# Patient Record
Sex: Female | Born: 1980 | Race: White | Hispanic: No | Marital: Married | Smoking: Never smoker
Health system: Southern US, Community
[De-identification: ages and names within clinical notes are randomized; demographics above are authoritative.]

## PROBLEM LIST (undated history)

## (undated) DIAGNOSIS — O09219 Supervision of pregnancy with history of pre-term labor, unspecified trimester: Secondary | ICD-10-CM

## (undated) HISTORY — PX: WISDOM TOOTH EXTRACTION: SHX21

## (undated) HISTORY — DX: Supervision of pregnancy with history of pre-term labor, unspecified trimester: O09.219

---

## 2004-04-16 ENCOUNTER — Other Ambulatory Visit: Admission: RE | Admit: 2004-04-16 | Discharge: 2004-04-16 | Payer: Self-pay | Admitting: Obstetrics and Gynecology

## 2005-01-27 ENCOUNTER — Inpatient Hospital Stay (HOSPITAL_COMMUNITY): Admission: AD | Admit: 2005-01-27 | Discharge: 2005-01-27 | Payer: Self-pay | Admitting: Obstetrics and Gynecology

## 2006-01-15 ENCOUNTER — Inpatient Hospital Stay (HOSPITAL_COMMUNITY): Admission: AD | Admit: 2006-01-15 | Discharge: 2006-01-15 | Payer: Self-pay | Admitting: Obstetrics & Gynecology

## 2007-11-12 ENCOUNTER — Inpatient Hospital Stay (HOSPITAL_COMMUNITY): Admission: AD | Admit: 2007-11-12 | Discharge: 2007-11-12 | Payer: Self-pay | Admitting: Obstetrics and Gynecology

## 2008-02-01 ENCOUNTER — Inpatient Hospital Stay (HOSPITAL_COMMUNITY): Admission: AD | Admit: 2008-02-01 | Discharge: 2008-02-04 | Payer: Self-pay | Admitting: Obstetrics and Gynecology

## 2009-07-19 ENCOUNTER — Inpatient Hospital Stay (HOSPITAL_COMMUNITY): Admission: AD | Admit: 2009-07-19 | Discharge: 2009-07-19 | Payer: Self-pay | Admitting: Obstetrics and Gynecology

## 2009-07-22 ENCOUNTER — Inpatient Hospital Stay (HOSPITAL_COMMUNITY): Admission: AD | Admit: 2009-07-22 | Discharge: 2009-07-22 | Payer: Self-pay | Admitting: Obstetrics and Gynecology

## 2010-01-05 ENCOUNTER — Ambulatory Visit (HOSPITAL_COMMUNITY): Admission: RE | Admit: 2010-01-05 | Discharge: 2010-01-05 | Payer: Self-pay | Admitting: Obstetrics and Gynecology

## 2010-02-28 LAB — ABO/RH

## 2010-02-28 LAB — RUBELLA ANTIBODY, IGM: Rubella: IMMUNE

## 2010-02-28 LAB — RPR: RPR: NONREACTIVE

## 2010-02-28 LAB — HIV ANTIBODY (ROUTINE TESTING W REFLEX): HIV: NONREACTIVE

## 2010-03-16 ENCOUNTER — Encounter: Payer: Self-pay | Admitting: Obstetrics and Gynecology

## 2010-05-12 LAB — RH IMMUNE GLOBULIN WORKUP (NOT WOMEN'S HOSP): ABO/RH(D): A NEG

## 2010-05-12 LAB — CREATININE, SERUM
Creatinine, Ser: 0.55 mg/dL (ref 0.4–1.2)
GFR calc Af Amer: >60 mL/min (ref >60)
GFR calc non Af Amer: >60 mL/min (ref >60)

## 2010-05-12 LAB — CBC: WBC: 7.9 10*3/uL (ref 4.0–10.5)

## 2010-05-12 LAB — DIFFERENTIAL
Basophils Relative: 0 % (ref 0–1)
Eosinophils Absolute: 0.1 10*3/uL (ref 0.0–0.7)
Eosinophils Relative: 1 % (ref 0–5)
Neutrophils Relative %: 66 % (ref 43–77)

## 2010-05-12 LAB — HCG, QUANTITATIVE, PREGNANCY
hCG, Beta Chain, Quant, S: 3074 m[IU]/mL — ABNORMAL HIGH (ref <5)
hCG, Beta Chain, Quant, S: 4485 m[IU]/mL — ABNORMAL HIGH (ref <5)

## 2010-05-12 LAB — AST: AST: 17 U/L (ref 0–37)

## 2010-05-12 LAB — PLATELET COUNT: Platelets: 227 10*3/uL (ref 150–400)

## 2010-05-12 LAB — BUN: BUN: 11 mg/dL (ref 6–23)

## 2010-07-04 ENCOUNTER — Other Ambulatory Visit (HOSPITAL_COMMUNITY): Payer: Self-pay | Admitting: Advanced Practice Midwife

## 2010-07-04 DIAGNOSIS — O2690 Pregnancy related conditions, unspecified, unspecified trimester: Secondary | ICD-10-CM

## 2010-07-08 ENCOUNTER — Encounter (HOSPITAL_COMMUNITY): Payer: Self-pay

## 2010-07-08 ENCOUNTER — Inpatient Hospital Stay (HOSPITAL_COMMUNITY)
Admission: AD | Admit: 2010-07-08 | Discharge: 2010-07-08 | Disposition: A | Discharge status: Home / Self Care | Payer: Federal, State, Local not specified - PPO | Source: Ambulatory Visit | Attending: Obstetrics and Gynecology | Admitting: Obstetrics and Gynecology

## 2010-07-08 ENCOUNTER — Other Ambulatory Visit (HOSPITAL_COMMUNITY): Payer: Self-pay | Admitting: Advanced Practice Midwife

## 2010-07-08 ENCOUNTER — Ambulatory Visit (HOSPITAL_COMMUNITY)
Admission: RE | Admit: 2010-07-08 | Discharge: 2010-07-08 | Disposition: A | Discharge status: Home / Self Care | Payer: Federal, State, Local not specified - PPO | Source: Ambulatory Visit | Attending: Advanced Practice Midwife | Admitting: Advanced Practice Midwife

## 2010-07-08 DIAGNOSIS — O358XX0 Maternal care for other (suspected) fetal abnormality and damage, not applicable or unspecified: Secondary | ICD-10-CM | POA: Insufficient documentation

## 2010-07-08 DIAGNOSIS — Z2989 Encounter for other specified prophylactic measures: Secondary | ICD-10-CM | POA: Insufficient documentation

## 2010-07-08 DIAGNOSIS — Z363 Encounter for antenatal screening for malformations: Secondary | ICD-10-CM | POA: Insufficient documentation

## 2010-07-08 DIAGNOSIS — Z1389 Encounter for screening for other disorder: Secondary | ICD-10-CM | POA: Insufficient documentation

## 2010-07-08 DIAGNOSIS — Z298 Encounter for other specified prophylactic measures: Secondary | ICD-10-CM | POA: Insufficient documentation

## 2010-07-08 DIAGNOSIS — O2690 Pregnancy related conditions, unspecified, unspecified trimester: Secondary | ICD-10-CM

## 2010-07-08 NOTE — H&P (Signed)
NAMESHEYANN, SULTON               ACCOUNT NO.:  [id number]   MEDICAL RECORD NO.:  [id number]          PATIENT TYPE:  INP   LOCATION:  9176                          FACILITY:  [facility]   PHYSICIAN:  Natasha Fields, M.D.   DATE OF BIRTH:  1980-04-20   DATE OF ADMISSION:  02/01/2008  DATE OF DISCHARGE:  12/01/2007                              HISTORY & PHYSICAL   HISTORY OF PRESENT ILLNESS:  Natasha Fields is a 30 year old married white  female gravida 3, para 0-0-2-0 at 40-4/7 weeks per an [facility] of January 28, 2008 who is presenting for an induction of labor secondary to oligo at  40 weeks and 4 days.  She was in the office today for a postdate BPP and  AFI and AFI was equal to 6.36 cm which was equal to 3rd percentile.  Placenta was a grade 3, BPP was a 6/8 secondary to no fluid level.  Fetus is in vertex presentation.  Placenta is posterior.  Estimated  fetal weight 8 pounds 6 ounces which was equal to 83rd percentile (3817  grams).  They are expecting a girl.  The patient is without complaints  today.  She does report good fetal movement.  She is having irregular  contractions, thinks has had more mucousy discharge and thinks that she  lost her mucus plug.  She denies any vaginal bleeding today.  She did  have some after her last exam last week.  Fundal height is measuring 39  cm.  Her weight today is 228.  She had trace protein on a dip.  Blood  pressure was originally 150/100 and then her repeat was 130/86 here at  the office.  she has been followed by a nurse midwife service at [facility].   PAST MEDICAL HISTORY:  1. Infertility.  2. Rh negative.  3. The patient had congenital hip dislocation.  4. Pectoral excavation history of.  5. SAB X2.   REVIEW OF SYSTEMS:  She is without any complaints.  She denied any PIH  signs or symptoms.   PRENATAL LABORATORIES:  Blood type is A negative, Rh antibody screen  negative, hemoglobin 12.8 at her new OB which was Jul 06, 2007,  hematocrit 38.2.  RPR  nonreactive, rubella titer immune, hepatitis  surface antigen negative, HIV nonreactive.  Pap in July 2008 was within  normal limits.  She declined gonorrhea and Chlamydia cultures.  GBS was  negative in her third trimester.   OBSTETRICAL HISTORY:  Rhett Bannister 1 was a spontaneous abortion in 2006.  Rhett Bannister 2 was a spontaneous abortion in 2007.  Gravida 3 is current  pregnancy.   PAST MEDICAL HISTORY:  She denies medication allergies or latex  allergies.  She does report seasonal allergies.  She reports menarche at  age 40 with monthly cycles.  No abnormalities.  She had a certain LMP of  April 23, 2007, giving her best [facility] of January 28, 2008.  She did  receive RhoGAM with her two other miscarriages.  Blood type is A  negative.  She had used birth control pills and stopped in 2006.  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  Reports varicella  as a child.  Denies any STDs or abnormal Pap smears.  The patient  reports wisdom teeth extracted 2004.  Reports anesthesia causes nausea  and vomiting.   SOCIAL HISTORY:  She is a married white female.  Her husband's name is  Sherlean Foot.  He is involved and supportive.  The patient has a  bachelor's degree, is a full-time Environmental health practitioner.  Father of  baby does have a full-time job and has a bachelor's degree.  The patient  used alcohol socially before pregnancy.  Denied any tobacco or illicit  drug use.   FAMILY HISTORY:  Paternal grandfather heart disease.  Paternal  grandfather and her father chronic hypertension.  Mom varicose veins.  Maternal grandfather emphysema.  Maternal aunts, maternal grandfather  and maternal uncle thyroid dysfunction.  Mom rheumatoid arthritis.  Paternal grandmother skin cancer.  Maternal grandfather smoker.   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   HISTORY OF PRESENT  PREGNANCY:  The patient entered care at [facility] May 13  for her new OB interview.  She was thought to be 10 weeks and 5 days.  She also had her new OB workup on that day.  Planned CNM care.  Very  excited with pregnancy.  She declined first trimester screen.  She had  anatomy ultrasound at 18-3/7 weeks, SIUP size consistent with previous  dating, cervical length 3.61 cm, regular rate and rhythm, posterior  placenta, normal fluid, all anatomy was seen, no anomalies observed.  It  was suggestive female.  She is having some insomnia at night.  The  thought that it was related to position.  Declined any other  intervention.  Her weight at her new OB appointment was 185.  Her height  is 5 feet 4 inches.  She said her pre gravid weight was 175.  She had  her 1-hour GTT at 27-3/7 weeks and it was within normal limits equal to  90.  RPR was nonreactive.  Hemoglobin was 11.6.  She was given her  Rhophylac prescription at that time.  Weight gain at 29 weeks was to  215.  Did have some borderline blood pressures around 31-3/7 weeks,  140/86 and repeat was 140/80.  She denied any PIH signs or symptoms.  She was encouraged to decrease her sodium and increase her water and was  brought back the next week for a recheck and was 126/74.  They did  attend childbirth education classes.  She did receive H1N1 as well as  seasonal flu shots.  Blood pressures remained within normal limits and  her exam today has had some just mild generalized lower extremity edema,  so weight today was 226 making total weight gain around 50 pounds  approximately from pre gravid weight.  She had been measuring size equal  to dates and sometimes about a week over prior to today.   OBJECTIVE:  As I noted, vital signs here, blood pressure was 150/100 and  repeat was 130/86.  She had trace protein in her urine.  Otherwise, she  was afebrile and vital signs stable.   PHYSICAL EXAMINATION:  GENERAL:  No acute distress, alert and  oriented  x3 and pleasant.  HEENT:  Grossly intact and within normal limits.  CARDIOVASCULAR:  Regular rate and rhythm without murmur.  LUNGS:  Clear to auscultation bilaterally.  ABDOMEN:  Soft, nontender, gravid.  Fundal height was 39.  Estimated  fetal weight on ultrasound was 8.6.  Cervical exam 3-4 cm, 80% effaced,  -1 station, vertex.  Membranes were swept.  A scant amount of blood was  noted on glove after membranes sweeping.  EXTREMITIES:  Just some trace to mild generalized edema.   IMPRESSION:  1. Intrauterine pregnancy at 40-4/7 weeks.  2. Borderline blood pressures with oligohydramnios with AFI equal to      6.36 and equal to 3rd percentile.  3. GBS negative.   PLAN:  1. Admit to birthing suites with Dr. Osborn Fields as attending      physician.  2. Routine CNM orders.  3. Epidural p.r.n.  4. Low-dose Pitocin IV for induction and Bishop score equal to 11.      Candice Denny Levy, CNM      ______________________________  Natasha Fields, M.D.    CHS/MEDQ  D:  02/01/2008  T:  02/01/2008  Job:  [id number]

## 2010-07-09 LAB — RH IMMUNE GLOBULIN WORKUP (NOT WOMEN'S HOSP): ABO/RH(D): A NEG

## 2010-07-29 LAB — STREP B DNA PROBE: GBS: NEGATIVE

## 2010-08-05 ENCOUNTER — Ambulatory Visit (HOSPITAL_COMMUNITY)
Admission: RE | Admit: 2010-08-05 | Discharge: 2010-08-05 | Disposition: A | Discharge status: Home / Self Care | Payer: Federal, State, Local not specified - PPO | Source: Ambulatory Visit | Attending: Advanced Practice Midwife | Admitting: Advanced Practice Midwife

## 2010-08-05 DIAGNOSIS — O358XX0 Maternal care for other (suspected) fetal abnormality and damage, not applicable or unspecified: Secondary | ICD-10-CM | POA: Insufficient documentation

## 2010-08-05 DIAGNOSIS — Z3689 Encounter for other specified antenatal screening: Secondary | ICD-10-CM | POA: Insufficient documentation

## 2010-09-22 ENCOUNTER — Inpatient Hospital Stay (HOSPITAL_COMMUNITY)
Admission: AD | Admit: 2010-09-22 | Discharge: 2010-09-23 | DRG: 373 | Disposition: A | Discharge status: Home / Self Care | Payer: Federal, State, Local not specified - PPO | Source: Ambulatory Visit | Attending: Obstetrics and Gynecology | Admitting: Obstetrics and Gynecology

## 2010-09-22 ENCOUNTER — Encounter (HOSPITAL_COMMUNITY): Payer: Self-pay | Admitting: *Deleted

## 2010-09-22 DIAGNOSIS — O9903 Anemia complicating the puerperium: Secondary | ICD-10-CM | POA: Diagnosis not present

## 2010-09-22 DIAGNOSIS — D649 Anemia, unspecified: Secondary | ICD-10-CM | POA: Diagnosis not present

## 2010-09-22 LAB — CBC
Hemoglobin: 13.2 g/dL (ref 12.0–15.0)
RBC: 4.53 MIL/uL (ref 3.87–5.11)
WBC: 9.3 10*3/uL (ref 4.0–10.5)

## 2010-09-22 LAB — RPR: RPR Ser Ql: NONREACTIVE

## 2010-09-22 MED ORDER — ONDANSETRON HCL 4 MG/2ML IJ SOLN: 4.0000 mg | INTRAMUSCULAR | Status: DC | PRN

## 2010-09-22 MED ORDER — PRENATAL PLUS 27-1 MG PO TABS
1.0000 | ORAL_TABLET | Freq: Every day | ORAL | Status: DC
  Administered 2010-09-23: 1 via ORAL
  Filled 2010-09-22: qty 1

## 2010-09-22 MED ORDER — IBUPROFEN 600 MG PO TABS
600.0000 mg | ORAL_TABLET | Freq: Four times a day (QID) | ORAL | Status: DC | PRN
  Administered 2010-09-22 – 2010-09-23 (×2): 600 mg via ORAL
  Filled 2010-09-22 (×4): qty 1

## 2010-09-22 MED ORDER — OXYCODONE-ACETAMINOPHEN 5-325 MG PO TABS
1.0000 | ORAL_TABLET | ORAL | Status: DC | PRN
  Filled 2010-09-22 (×3): qty 2

## 2010-09-22 MED ORDER — NALBUPHINE SYRINGE 5 MG/0.5 ML
5.0000 mg | INJECTION | INTRAMUSCULAR | Status: DC | PRN
  Filled 2010-09-22: qty 0.5

## 2010-09-22 MED ORDER — EPHEDRINE 5 MG/ML INJ
10.0000 mg | INTRAVENOUS | Status: DC | PRN
  Filled 2010-09-22 (×2): qty 4

## 2010-09-22 MED ORDER — TETANUS-DIPHTH-ACELL PERTUSSIS 5-2.5-18.5 LF-MCG/0.5 IM SUSP
0.5000 mL | Freq: Once | INTRAMUSCULAR | Status: AC
  Administered 2010-09-23: 0.5 mL via INTRAMUSCULAR
  Filled 2010-09-22: qty 0.5

## 2010-09-22 MED ORDER — ONDANSETRON HCL 4 MG PO TABS: 4.0000 mg | ORAL_TABLET | ORAL | Status: DC | PRN

## 2010-09-22 MED ORDER — LACTATED RINGERS IV SOLN
500.0000 mL | INTRAVENOUS | Status: DC | PRN
Start: 2010-09-22 — End: 2010-09-23

## 2010-09-22 MED ORDER — SENNOSIDES-DOCUSATE SODIUM 8.6-50 MG PO TABS
1.0000 | ORAL_TABLET | Freq: Every day | ORAL | Status: DC
  Administered 2010-09-22: 1 via ORAL

## 2010-09-22 MED ORDER — OXYCODONE-ACETAMINOPHEN 5-325 MG PO TABS
2.0000 | ORAL_TABLET | ORAL | Status: DC | PRN
  Filled 2010-09-22 (×3): qty 2

## 2010-09-22 MED ORDER — FENTANYL 2.5 MCG/ML BUPIVACAINE 1/10 % EPIDURAL INFUSION (WH - ANES)
14.0000 mL/h | INTRAMUSCULAR | Status: DC
  Filled 2010-09-22: qty 60

## 2010-09-22 MED ORDER — BENZOCAINE-MENTHOL 20-0.5 % EX AERO
INHALATION_SPRAY | CUTANEOUS | Status: AC
  Administered 2010-09-22: 1 via TOPICAL
  Filled 2010-09-22: qty 56

## 2010-09-22 MED ORDER — LACTATED RINGERS IV SOLN: 500.0000 mL | Freq: Once | INTRAVENOUS | Status: DC

## 2010-09-22 MED ORDER — HYDROXYZINE HCL 50 MG PO TABS
50.0000 mg | ORAL_TABLET | Freq: Four times a day (QID) | ORAL | Status: DC | PRN
  Filled 2010-09-22: qty 1

## 2010-09-22 MED ORDER — ACETAMINOPHEN 325 MG PO TABS: 650.0000 mg | ORAL_TABLET | ORAL | Status: DC | PRN

## 2010-09-22 MED ORDER — IBUPROFEN 600 MG PO TABS
600.0000 mg | ORAL_TABLET | Freq: Four times a day (QID) | ORAL | Status: DC
  Administered 2010-09-22 – 2010-09-23 (×3): 600 mg via ORAL

## 2010-09-22 MED ORDER — LIDOCAINE HCL (PF) 1 % IJ SOLN
30.0000 mL | INTRAMUSCULAR | Status: DC | PRN
  Filled 2010-09-22 (×2): qty 30

## 2010-09-22 MED ORDER — DIPHENHYDRAMINE HCL 25 MG PO CAPS: 25.0000 mg | ORAL_CAPSULE | Freq: Four times a day (QID) | ORAL | Status: DC | PRN

## 2010-09-22 MED ORDER — MEASLES, MUMPS & RUBELLA VAC ~~LOC~~ INJ
0.5000 mL | INJECTION | Freq: Once | SUBCUTANEOUS | Status: DC
  Filled 2010-09-22: qty 0.5

## 2010-09-22 MED ORDER — BENZOCAINE-MENTHOL 20-0.5 % EX AERO
1.0000 "application " | INHALATION_SPRAY | CUTANEOUS | Status: DC | PRN
  Administered 2010-09-22: 1 via TOPICAL

## 2010-09-22 MED ORDER — LANOLIN HYDROUS EX OINT: TOPICAL_OINTMENT | CUTANEOUS | Status: DC | PRN

## 2010-09-22 MED ORDER — ONDANSETRON HCL 4 MG/2ML IJ SOLN: 4.0000 mg | Freq: Four times a day (QID) | INTRAMUSCULAR | Status: DC | PRN

## 2010-09-22 MED ORDER — FLEET ENEMA 7-19 GM/118ML RE ENEM: 1.0000 | ENEMA | RECTAL | Status: DC | PRN

## 2010-09-22 MED ORDER — FERROUS SULFATE 325 (65 FE) MG PO TABS
325.0000 mg | ORAL_TABLET | Freq: Two times a day (BID) | ORAL | Status: DC
  Administered 2010-09-22 – 2010-09-23 (×2): 325 mg via ORAL
  Filled 2010-09-22 (×2): qty 1

## 2010-09-22 MED ORDER — PHENYLEPHRINE 40 MCG/ML (10ML) SYRINGE FOR IV PUSH (FOR BLOOD PRESSURE SUPPORT)
80.0000 ug | PREFILLED_SYRINGE | INTRAVENOUS | Status: DC | PRN
  Filled 2010-09-22 (×2): qty 5

## 2010-09-22 MED ORDER — ZOLPIDEM TARTRATE 5 MG PO TABS: 5.0000 mg | ORAL_TABLET | Freq: Every evening | ORAL | Status: DC | PRN

## 2010-09-22 MED ORDER — CITRIC ACID-SODIUM CITRATE 334-500 MG/5ML PO SOLN: 30.0000 mL | ORAL | Status: DC | PRN

## 2010-09-22 MED ORDER — HYDROXYZINE HCL 50 MG/ML IM SOLN
50.0000 mg | Freq: Four times a day (QID) | INTRAMUSCULAR | Status: DC | PRN
  Filled 2010-09-22: qty 1

## 2010-09-22 MED ORDER — DIPHENHYDRAMINE HCL 50 MG/ML IJ SOLN: 12.5000 mg | INTRAMUSCULAR | Status: DC | PRN

## 2010-09-22 MED ORDER — PHENYLEPHRINE 40 MCG/ML (10ML) SYRINGE FOR IV PUSH (FOR BLOOD PRESSURE SUPPORT)
80.0000 ug | PREFILLED_SYRINGE | INTRAVENOUS | Status: DC | PRN
  Filled 2010-09-22: qty 5

## 2010-09-22 MED ORDER — EPHEDRINE 5 MG/ML INJ
10.0000 mg | INTRAVENOUS | Status: DC | PRN
  Filled 2010-09-22: qty 4

## 2010-09-22 MED ORDER — WITCH HAZEL-GLYCERIN EX PADS: MEDICATED_PAD | CUTANEOUS | Status: DC | PRN

## 2010-09-22 MED ORDER — MEDROXYPROGESTERONE ACETATE 150 MG/ML IM SUSP: 150.0000 mg | INTRAMUSCULAR | Status: DC | PRN

## 2010-09-22 MED ORDER — LACTATED RINGERS IV SOLN
INTRAVENOUS | Status: DC
  Administered 2010-09-22: 10:00:00 via INTRAVENOUS

## 2010-09-22 MED ORDER — OXYTOCIN 20 UNITS IN LACTATED RINGERS INFUSION - SIMPLE
INTRAVENOUS | Status: AC
  Administered 2010-09-22: 20000 m[IU]
  Filled 2010-09-22: qty 1000

## 2010-09-22 MED ORDER — SIMETHICONE 80 MG PO CHEW: 80.0000 mg | CHEWABLE_TABLET | ORAL | Status: DC | PRN

## 2010-09-22 MED ORDER — MAGNESIUM HYDROXIDE 400 MG/5ML PO SUSP: 30.0000 mL | ORAL | Status: DC | PRN

## 2010-09-22 NOTE — Progress Notes (Signed)
Water broke at 0730, still coming, clear fluid.  Sporadic ctx's, q 5-10.  No bleeding.

## 2010-09-22 NOTE — Procedures (Signed)
Delivery Note At 1008 a viable female named "Natasha Fields" was delivered via  (Presentation: OA  ).  APGAR:9 ,9 ; weight=8+12 .   Placenta status-Spontaneous, grossly intact, trailing membranes, Schultz: , 3vCord:  with the following complications: .   Anesthesia:  Local for repair  Episiotomy: none  Lacerations: 2nd degree Suture Repair: 3.0 monocryl Est. Blood Loss (mL):  Mom to postpartum.  Baby to nursery-stable.  Emanuel Dowson H 09/22/2010, 11:55 AM

## 2010-09-23 LAB — CBC
HCT: 32.8 % — ABNORMAL LOW (ref 36.0–46.0)
MCHC: 32.3 g/dL (ref 30.0–36.0)
MCV: 89.4 fL (ref 78.0–100.0)
RDW: 13.4 % (ref 11.5–15.5)

## 2010-09-23 MED ORDER — IBUPROFEN 200 MG PO TABS: 600.0000 mg | ORAL_TABLET | Freq: Four times a day (QID) | ORAL | Status: AC | PRN

## 2010-09-23 MED ORDER — RHO D IMMUNE GLOBULIN 1500 UNIT/2ML IJ SOLN
300.0000 ug | Freq: Once | INTRAMUSCULAR | Status: AC
  Administered 2010-09-23: 300 ug via INTRAMUSCULAR
  Filled 2010-09-23: qty 2

## 2010-09-23 MED ORDER — ACETAMINOPHEN 325 MG PO TABS: 650.0000 mg | ORAL_TABLET | ORAL | Status: AC | PRN

## 2010-09-23 NOTE — H&P (Signed)
Natasha Fields is a 30 y.o.MW female presenting for SROM at approximately 0750 and subsequent onset of regular & strong contractions. Pregnancy remarkable for 1.  Rh Neg.  2.  H/o infertility w/ luteal phase defect (used Prochieve in past).  3.  H/o SAB x2  4.  Increased BMI  Maternal Medical History:  Reason for admission: Reason for admission: rupture of membranes and contractions.  Contractions: Onset was less than 1 hour ago.   Frequency: regular.   Perceived severity is strong.    Fetal activity: Perceived fetal activity is normal.   Last perceived fetal movement was within the past hour.      OB History    Grav Para Term Preterm Abortions TAB SAB Ect Mult Living   4 2 2  0 2 0 2 0 0 2     Past Medical History  Diagnosis Date  . No pertinent past medical history   Pt born with hip dislocation & pectoral excoration.  No past surgical history on file.Wisdom teeth  Family History: family history is not on file.  CHT:  PGF & dad; MGF emphysema; M. Aunts, Zettie Cooley & MGF-thyroid dysfunction; Mom:  RA & varicose veins;    PGM-skin CA Social History:  reports that she has never smoked. She has never used smokeless tobacco. She reports that she does not drink alcohol or use illicit drugs.  Review of Systems  Constitutional: Negative.   Respiratory: Negative.   Cardiovascular: Negative.   Gastrointestinal: Negative.   Genitourinary: Negative.   Skin: Negative.   Neurological: Negative.    VS on admission:  110/77, 20, 109, 98.9 (oral)  Maternal Exam:  Uterine Assessment: Contraction strength is firm.  Contraction frequency is regular.   Abdomen: Patient reports no abdominal tenderness. Estimated fetal weight is 8 to 9 obs.   Fetal presentation: vertex  Introitus: Normal vulva. Normal vagina.    Fetal Exam Fetal Monitor Review: Mode: ultrasound.   Baseline rate: 130.  Variability: moderate (6-25 bpm).   Pattern: accelerations present and no decelerations.    Fetal  State Assessment: Category I - tracings are normal.     Physical Exam  Constitutional: She is oriented to person, place, and time. She appears well-developed and well-nourished. She appears distressed.  Cardiovascular: Normal rate and regular rhythm.   Respiratory: Breath sounds normal.       Labored breathing w/ ctxs  GI: Soft. Bowel sounds are normal.       gravid  Genitourinary: Vagina normal.       Cx:  8/100/0 to +1; no membranes palpated.  Clear fluid noted, as well as mucousy d/c.  No VB.  Musculoskeletal: Normal range of motion. She exhibits edema.       Trace to mild generalized edema BLE  Neurological: She is alert and oriented to person, place, and time. She has normal reflexes.  Skin: Skin is warm and dry.  Psychiatric: She has a normal mood and affect. Her behavior is normal. Judgment and thought content normal.    Prenatal labs: ABO, Rh: A NEG (05/15 1433) Antibody: NEG (05/15 1433) Rubella:  Immune RPR: NON REACTIVE (07/30 0950)  HBsAg: Negative (01/06 0000)  HIV: Non-reactive (01/06 0000)  GBS: Negative (06/05 0000)   Assessment/Plan: 1.  IUP at 40.2 2.  Transition 3.  GBS Negative 4.  Category I FHT 5.  Desires epidural  1.  Admit to BS with Dr. Stefano Gaul as attending with routine L&D orders 2.  Epidural ASAP 3. Continuous  BS support 4.  Anticipate SVD 5.  C/W MD prn  Delanee Xin H 09/22/10, 0930

## 2010-09-23 NOTE — Progress Notes (Signed)
Post Partum Day 1 Subjective: no complaints, up ad lib, voiding, tolerating PO, + flatus and breastfeeding well.  Parents in town from [place] to help, and husband's parents live less than 1 hr away.  Will defer birth control at present.  Motrin for pain.  VB stable S.o at bs and very supportive. Objective: Blood pressure 122/78, pulse 87, temperature 98 F (36.7 C), temperature source Oral, resp. rate 18, height 5\' 4"  (1.626 m), weight 93.441 kg (206 lb), unknown if currently breastfeeding.  Physical Exam:  General: alert, cooperative, no distress and mildly obese Lochia: appropriate Uterine Fundus: firm below umbilicus Perineum healing; intact Incision: n/a DVT Evaluation: No evidence of DVT seen on physical exam. Negative Homan's sign. No significant calf/ankle edema.   Basename 09/23/10 0505 09/22/10 0950  HGB 10.6* 13.2  HCT 32.8* 39.9    Assessment/Plan: Discharge home, Breastfeeding, Circumcision prior to discharge and Contraception Undecided Rh neg--Rhophylac before discharge if NB Rh pos & antibody screen neg.  Pt declines prescriptions & to use OTC motrin & tylenol.   D/C instructions per [facility] pamphlet.   F/u 6 wks at [facility] or prn.   LOS: 1 day   Neville Pauls H 09/23/2010, 10:53 AM

## 2010-09-23 NOTE — Discharge Summary (Signed)
Obstetric Discharge Summary Reason for Admission: onset of labor and rupture of membranes Prenatal Procedures: ultrasound Intrapartum Procedures: spontaneous vaginal delivery Postpartum Procedures: Rho(D) Ig Complications-Operative and Postpartum: 2nd degree perineal laceration Hemoglobin  Date Value Range Status  09/23/2010 10.6* 12.0-15.0 (g/dL) Final     DELTA CHECK NOTED     REPEATED TO VERIFY     HCT  Date Value Range Status  09/23/2010 32.8* 36.0-46.0 (%) Final    Discharge Diagnoses: Term Pregnancy-delivered and Rh neg; sl PP anemia without s/s. Rapid Labor.  Discharge Information: Date: 09/23/2010 Activity: pelvic rest Diet: routine Medications: PNV, Ibuprophen and tylenol Condition: stable Instructions: refer to practice specific booklet Discharge to: home Follow-up Information    Follow up with [facility] in 6 weeks. (or as needed)          Newborn Data: Live born female "Gerilyn Pilgrim" Birth Weight: 8 lb 12.9 oz (3994 g) APGAR: 9, 9 Outpatient circumcision. Home with mother.  Keshonna Valvo H 09/23/2010, 10:57 AM

## 2010-09-24 LAB — RH IG WORKUP (INCLUDES ABO/RH): Unit division: 0

## 2010-11-24 LAB — RH IMMUNE GLOBULIN WORKUP (NOT WOMEN'S HOSP)
ABO/RH(D): A NEG
Antibody Screen: NEGATIVE

## 2010-11-28 LAB — CBC
HCT: 30 % — ABNORMAL LOW (ref 36.0–46.0)
HCT: 35.5 % — ABNORMAL LOW (ref 36.0–46.0)
Hemoglobin: 11.9 g/dL — ABNORMAL LOW (ref 12.0–15.0)
MCHC: 33.6 g/dL (ref 30.0–36.0)
MCHC: 33.8 g/dL (ref 30.0–36.0)
MCV: 85.9 fL (ref 78.0–100.0)
RBC: 3.49 MIL/uL — ABNORMAL LOW (ref 3.87–5.11)
RBC: 4.15 MIL/uL (ref 3.87–5.11)
RDW: 12.6 % (ref 11.5–15.5)

## 2010-11-28 LAB — RH IMMUNE GLOB WKUP(>/=20WKS)(NOT WOMEN'S HOSP): Fetal Screen: NEGATIVE

## 2011-05-19 IMAGING — US US FOLLICLE
1 series · 13 of 16 positions shown · non-contrast
Comparison: None.

CLINICAL DATA: Follicles study.

US PELVIS LIMITED OR FOLLOW UP

[Series 1: us follicle · 0.14mm/px · 34 acquisitions, 13 frames shown]
[im 1/34]
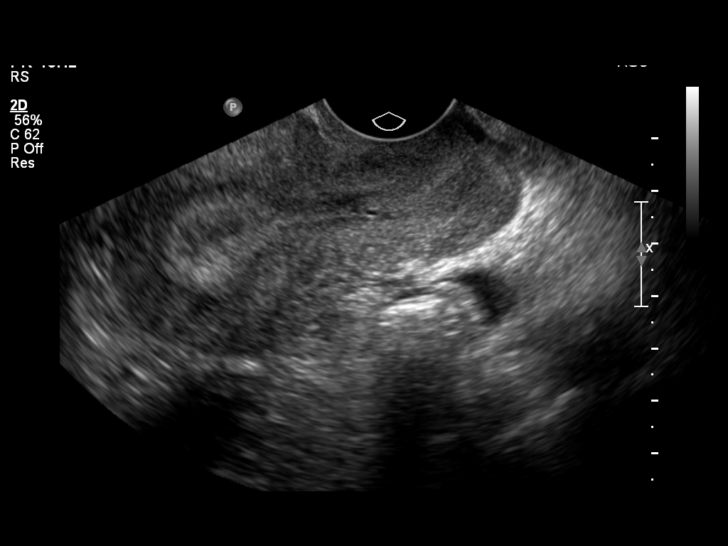
[im 3/34]
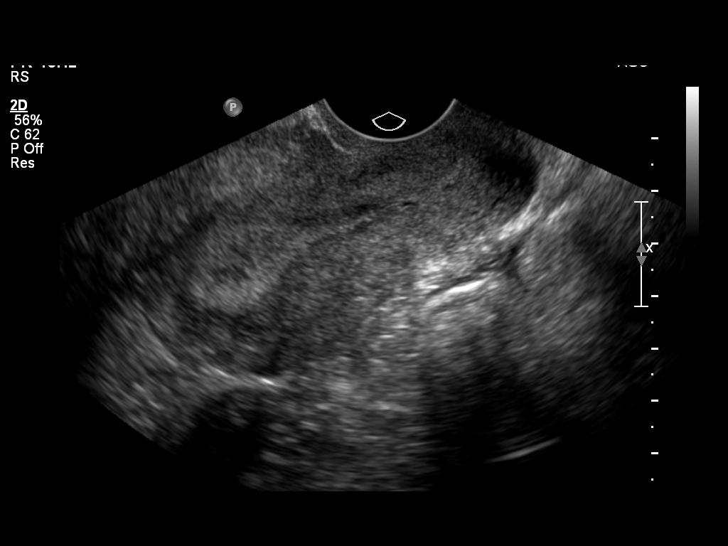
[im 7/34]
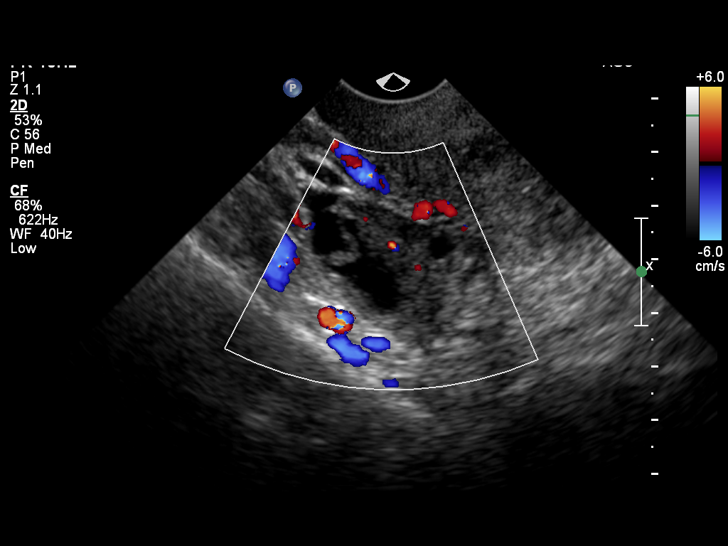
[im 9/34]
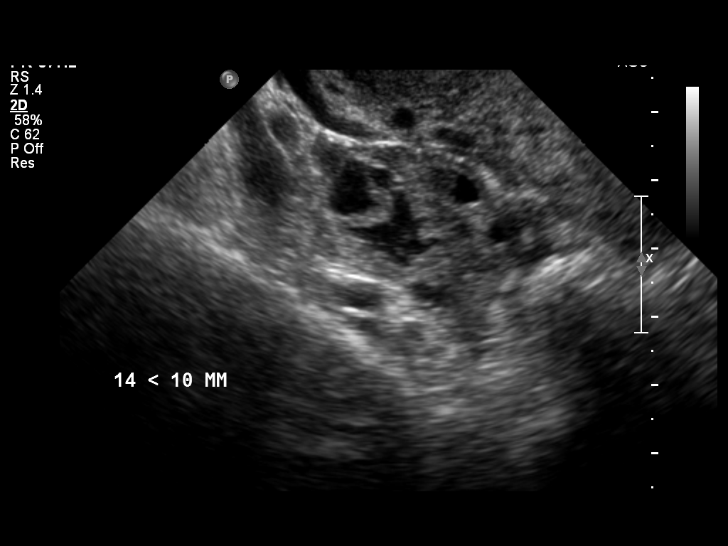
[im 12/34]
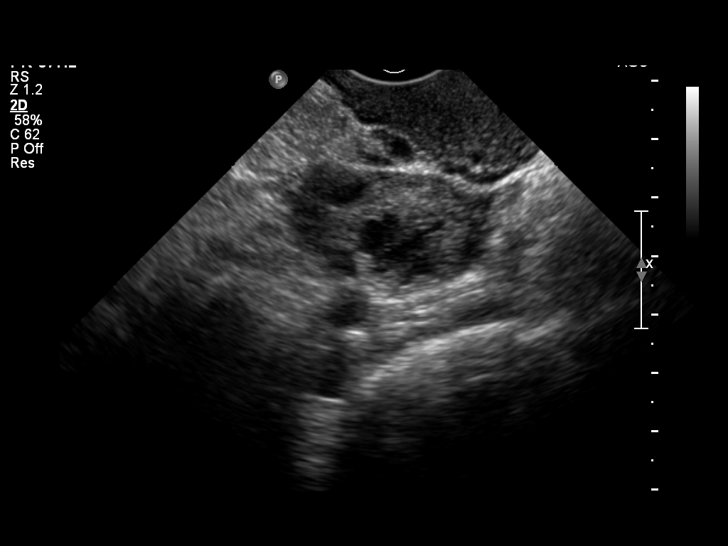
[im 14/34]
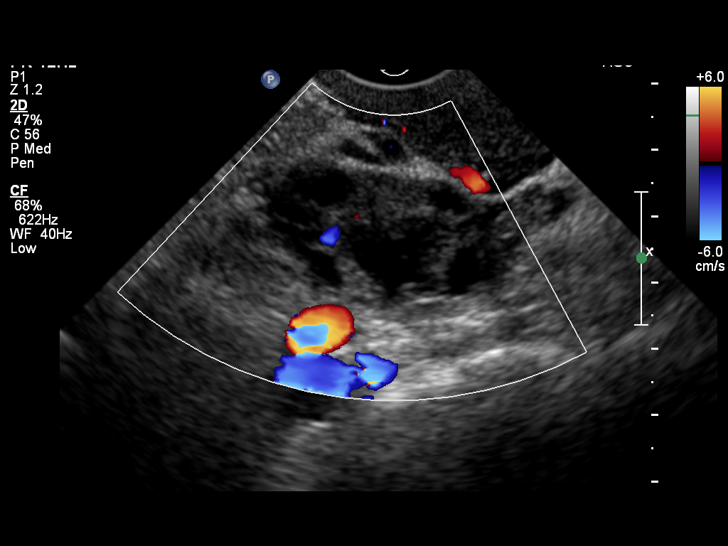
[im 18/34]
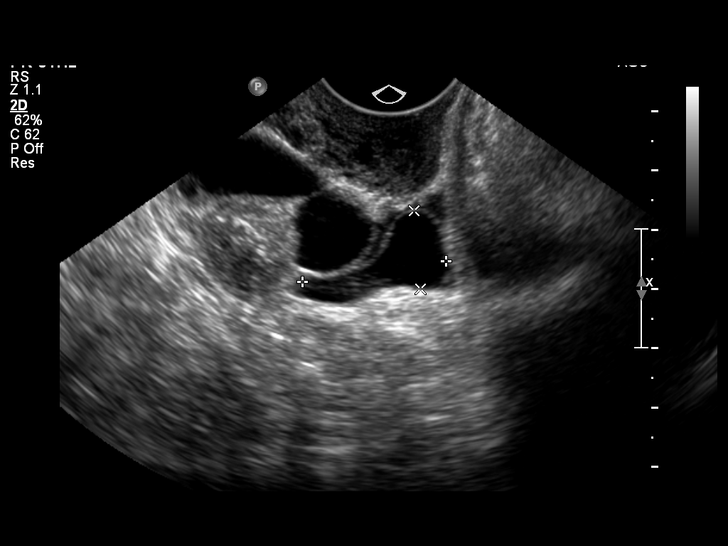
[im 20/34]
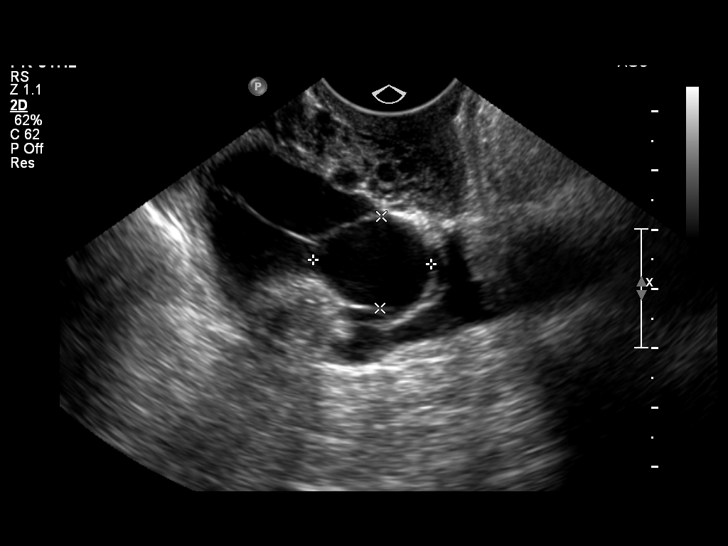
[im 23/34]
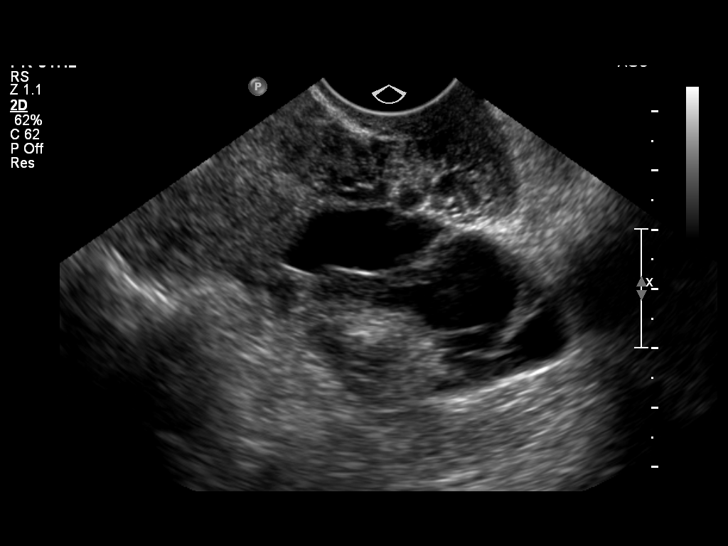
[im 25/34]
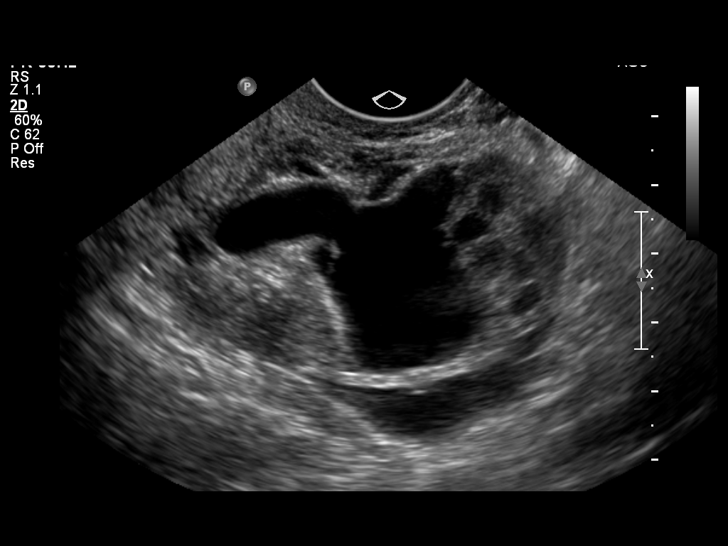
[im 27/34]
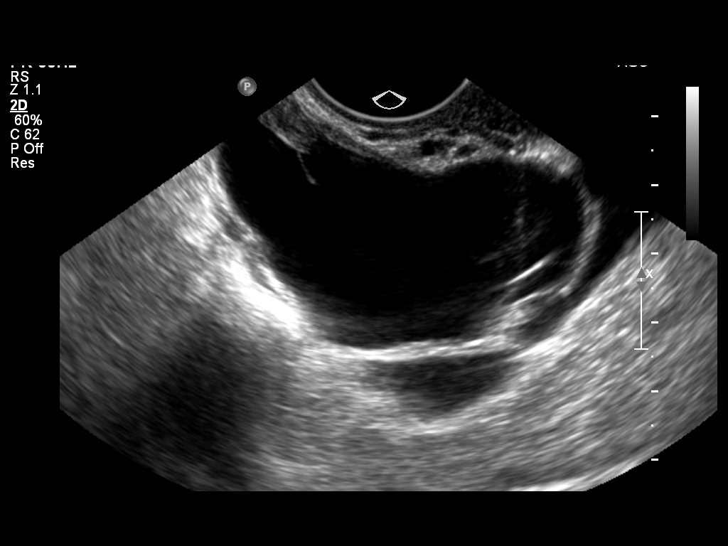
[im 31/34]
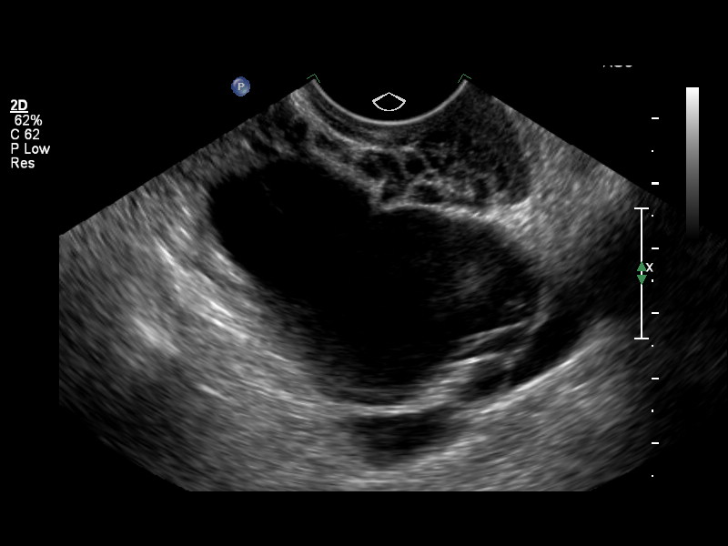
[im 34/34]
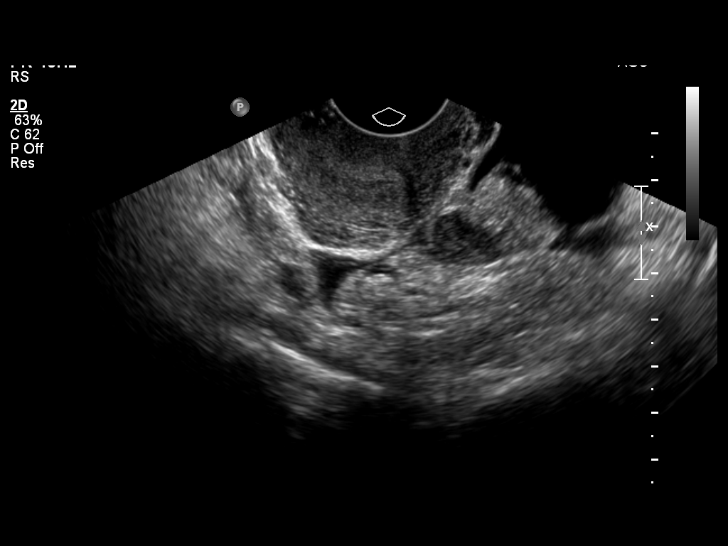

[13 of 16 positions shown; findings below may reference images not displayed]

FINDINGS: Endometrium is 16 mm in thickness and appears try
layered.

Multiple follicles are seen in the right ovary, measuring less than
10 mm.  A total of 14 follicles measuring less than 10 mm
identified.  18 mm follicle with a somewhat geometric shape
suggests a collapsing follicle.

In the left adnexal space, the patient has a large, somewhat
irregular, cystic process.  This may measure as large as 5.4 x
cm, but there are multiple apparent internal incomplete septations.
I think it is unlikely that this represents a cluster of smaller
adjacent cysts.  Although a hydrosalpinx is a consideration, this
is not a classic sonographic appearance.  The patient does have
other follicles in the left ovarian parenchyma including one that
measures 1.5 x 1.8 x 2.5 cm.  A second follicle measures 2.0 x
x 1.7 cm.  A third follicle measures 2.6 x 1.1 x 2.0 cm.  11 and
tiny follicles measuring less than 10 mm are identified.

There is a small amount of free fluid seen in the cul-de-sac.  This
fluid does not appear entirely simple and may be complicated by
some proteinaceous debris or hemorrhage.
IMPRESSION: Numerous follicles measuring less than 10 mm in the right ovary
with a apparent collapsing cyst 18 mm follicle within the
parenchyma.

Large cystic process in the left ovary is probably a complex cyst
with multiple internal septations.  Three additional apparent
follicles in the left ovary measure 2.5, 2.0, and 2.6 cm in maximum
dimensions.  A component of hydrosalpinx on the left cannot be
completely excluded.

Small amount of potentially complex fluid is identified in the cul-
de-sac.

## 2012-02-24 NOTE — L&D Delivery Note (Signed)
Delivery Note At 11:33 PM a viable female was delivered via Vaginal, Spontaneous Delivery (Presentation: ROA ).  APGAR: 8, 8; weight pending.  Directly after delivery a cord evulsion was noted and clamped. Placenta status: Intact, Spontaneous, Duncan.  Cord: 3 vessels with the following complications: Short.  Cord pH: not collected  Anesthesia: Local  Episiotomy: None Lacerations: 2nd degree vaginal and perineal Suture Repair: 3.0 vicryl Est. Blood Loss (mL): 200  Mom to postpartum.  Baby to skin to skin. Placenta sent to pathology Routine PP orders Breastfeeding Unsure of contraception at this time  Haroldine Laws 11/13/2012, 12:19 AM

## 2012-03-10 ENCOUNTER — Ambulatory Visit: Payer: Self-pay

## 2012-03-15 ENCOUNTER — Ambulatory Visit: Payer: Federal, State, Local not specified - PPO | Admitting: Obstetrics and Gynecology

## 2012-03-15 ENCOUNTER — Encounter: Payer: Self-pay | Admitting: Obstetrics and Gynecology

## 2012-03-15 VITALS — BP 110/70 | Ht 63.0 in | Wt 169.0 lb

## 2012-03-15 DIAGNOSIS — Z8742 Personal history of other diseases of the female genital tract: Secondary | ICD-10-CM

## 2012-03-15 DIAGNOSIS — Z01419 Encounter for gynecological examination (general) (routine) without abnormal findings: Secondary | ICD-10-CM

## 2012-03-15 DIAGNOSIS — S29011A Strain of muscle and tendon of front wall of thorax, initial encounter: Secondary | ICD-10-CM

## 2012-03-15 DIAGNOSIS — Z124 Encounter for screening for malignant neoplasm of cervix: Secondary | ICD-10-CM

## 2012-03-15 DIAGNOSIS — Z8776 Personal history of (corrected) congenital malformations of integument, limbs and musculoskeletal system: Secondary | ICD-10-CM

## 2012-03-15 DIAGNOSIS — O039 Complete or unspecified spontaneous abortion without complication: Secondary | ICD-10-CM

## 2012-03-15 DIAGNOSIS — Z6791 Unspecified blood type, Rh negative: Secondary | ICD-10-CM

## 2012-03-15 NOTE — Progress Notes (Signed)
Subjective:    Natasha Fields is a 32 y.o. female, [id number], who presents for an annual exam.   Patient reports:  Doing well.  May consider conception in next 1-2 years. Hx 2 SABs, use of progesterone in past.    History   Social History  . Marital Status: Married    Spouse Name: N/A    Number of Children: N/A  . Years of Education: N/A   Social History Main Topics  . Smoking status: Never Smoker   . Smokeless tobacco: Never Used  . Alcohol Use: No  . Drug Use: No  . Sexually Active: Yes    Birth Control/ Protection: Condom   Other Topics Concern  . None   Social History Narrative  . None    Menstrual cycle:   LMP: Patient's last menstrual period was 02/10/2012.           Cycle: WNL  The following portions of the patient's history were reviewed and updated as appropriate: allergies, current medications, past family history, past medical history, past social history, past surgical history and problem list.  Review of Systems Pertinent items are noted in HPI. Breast:Negative for breast lump,nipple discharge or nipple retraction Gastrointestinal: Negative for abdominal pain, change in bowel habits or rectal bleeding Urinary:negative   Objective:    BP 110/70  Ht 5\' 3"  (1.6 m)  Wt 169 lb (76.658 kg)  BMI 29.94 kg/m2  LMP 02/10/2012  Breastfeeding? No    Weight:  Wt Readings from Last 1 Encounters:  03/15/12 169 lb (76.658 kg)          BMI: Body mass index is 29.94 kg/(m^2).  General Appearance: Alert, appropriate appearance for age. No acute distress HEENT: Grossly normal Neck / Thyroid: Supple, no masses, nodes or enlargement Lungs: clear to auscultation bilaterally Back: No CVA tenderness Breast Exam: No masses or nodes.No dimpling, nipple retraction or discharge. Cardiovascular: Regular rate and rhythm. S1, S2, no murmur Gastrointestinal: Soft, non-tender, no masses or organomegaly Pelvic Exam: Vulva and vagina appear normal. Bimanual exam reveals normal  uterus and adnexa. Rectovaginal: normal rectal, no masses Lymphatic Exam: Non-palpable nodes in neck, clavicular, axillary, or inguinal regions Skin: no rash or abnormalities Neurologic: Normal gait and speech, no tremor  Psychiatric: Alert and oriented, appropriate affect.   Wet Prep:not applicable Urinalysis:not applicable UPT: Not done   Assessment:    Normal gyn exam     Plan:    Mammogram: Age 67  Pap:  Done today STD screening: declined Contraception:condoms Other:  Considering conception in future--will need progesterone suppositories.  Anticipates calling when getting ready for conception to discuss plan with SR, due to possible need for Clomid and progesterone suppositories.      Nyra Capes, [place]

## 2012-03-15 NOTE — Progress Notes (Signed)
Last Pap: 05/06/2009 WNL: Yes Regular Periods:yes Contraception: condoms   Monthly Breast exam:yes Tetanus<59yrs:no Nl.Bladder Function:yes Daily BMs:yes Healthy Diet:yes Calcium:yes Mammogram:no Exercise:yes Have often Exercise: 4 times a week  Seatbelt: yes Abuse at home: no Stressful work:no PCP: Dr.Jobe Change in PMH: unchanged  Change in ZOX:[id number]  Pt stated no issues today.

## 2012-03-16 DIAGNOSIS — Z6791 Unspecified blood type, Rh negative: Secondary | ICD-10-CM | POA: Insufficient documentation

## 2012-03-16 DIAGNOSIS — S29011A Strain of muscle and tendon of front wall of thorax, initial encounter: Secondary | ICD-10-CM | POA: Insufficient documentation

## 2012-03-16 DIAGNOSIS — Z87768 Personal history of other specified (corrected) congenital malformations of integument, limbs and musculoskeletal system: Secondary | ICD-10-CM | POA: Insufficient documentation

## 2012-03-16 DIAGNOSIS — Z8776 Personal history of (corrected) congenital malformations of integument, limbs and musculoskeletal system: Secondary | ICD-10-CM | POA: Insufficient documentation

## 2012-03-16 DIAGNOSIS — O039 Complete or unspecified spontaneous abortion without complication: Secondary | ICD-10-CM | POA: Insufficient documentation

## 2012-03-16 DIAGNOSIS — Z8742 Personal history of other diseases of the female genital tract: Secondary | ICD-10-CM | POA: Insufficient documentation

## 2012-03-16 LAB — PAP IG W/ RFLX HPV ASCU

## 2012-03-21 ENCOUNTER — Other Ambulatory Visit: Payer: Self-pay | Admitting: Obstetrics and Gynecology

## 2012-03-21 ENCOUNTER — Telehealth: Payer: Self-pay | Admitting: Obstetrics and Gynecology

## 2012-03-21 DIAGNOSIS — O2620 Pregnancy care for patient with recurrent pregnancy loss, unspecified trimester: Secondary | ICD-10-CM

## 2012-03-21 MED ORDER — PROGESTERONE 8 % VA GEL: 1.0000 | Freq: Every day | VAGINAL | Status: DC

## 2012-03-21 NOTE — Telephone Encounter (Signed)
Lm on vm for pt to call back.

## 2012-03-21 NOTE — Telephone Encounter (Signed)
Yes--progesterone suppositories, 25 mcg per vagina BID. Dispense 1 month supply, refills x 4. VL

## 2012-03-21 NOTE — Telephone Encounter (Signed)
Correction--patient on Crinone gel 8%, one applicator full per vagina q hs. Will Rx to [facility]. Declines serial QHCGs at present, unless has bleeding. Will schedule [place] for viability at 7-8 weeks. Office to call and schedule on 2/6.

## 2012-03-21 NOTE — Telephone Encounter (Signed)
TC from patient in response to my call regarding her need for progesterone suppositories--+UPT yesterday, hx SABs x 2, use of progesterone suppositories in previous pregnancies.  Had called office requesting refill. Just missed 1st cycle.  No bleeding. Used progesterone suppositories with last pregnancy, had 3 left (started tonight, has 2 for tomorrow). Needs choice of pharmacy corrected in EPIC ([facility]). Will change in system and Rx progesterone suppositories. Will offer serial QHCGs in light of hx of 2 previous SABs.

## 2012-03-21 NOTE — Telephone Encounter (Signed)
Update:  I think she used Prochieve gel in the past rather than the suppositories-- I need to look up the dose... VL

## 2012-03-21 NOTE — Telephone Encounter (Signed)
Pt will need progesterone suppositories if she conceives per last note. Pt states she had positive upt, can rx be sent to her pharmacy?

## 2012-03-22 ENCOUNTER — Telehealth: Payer: Self-pay | Admitting: Obstetrics and Gynecology

## 2012-03-22 ENCOUNTER — Other Ambulatory Visit: Payer: Self-pay | Admitting: Obstetrics and Gynecology

## 2012-03-22 DIAGNOSIS — O26849 Uterine size-date discrepancy, unspecified trimester: Secondary | ICD-10-CM

## 2012-03-22 NOTE — Telephone Encounter (Signed)
Pt returned call regarding msg.  Pt scheduled for viability [place] and f/u on Thursday 03/31/12 beginning @ 0830.  Pt advised to call with any concerns prior to appt.

## 2012-03-22 NOTE — Telephone Encounter (Signed)
Tc to pt regarding msg below.  Lm on vm to call back.

## 2012-03-22 NOTE — Telephone Encounter (Signed)
Message copied by Delon Sacramento on Tue Mar 22, 2012  9:18 AM ------      Message from: Cornelius Moras      Created: Mon Mar 21, 2012 10:04 PM      Regarding: Scheduling [place]        Please schedule patient for viability [place] on 03/31/12 with me.      She should be approx 7-8 weeks then.  Hx 2 SABs, no bleeding.      Thanks!      VL

## 2012-03-22 NOTE — Telephone Encounter (Signed)
TC to pt. States needs prior auth for Progesterone gel. Advised to have pharmacy fax form.  Samples for Progesterone gel 8% left at front desk for pt.

## 2012-03-24 ENCOUNTER — Telehealth: Payer: Self-pay | Admitting: Obstetrics and Gynecology

## 2012-03-24 NOTE — Telephone Encounter (Signed)
TC to pt. Informed Rx for Crinone has been preauthorized.

## 2012-03-31 ENCOUNTER — Ambulatory Visit: Payer: Federal, State, Local not specified - PPO

## 2012-03-31 ENCOUNTER — Encounter: Payer: Self-pay | Admitting: Obstetrics and Gynecology

## 2012-03-31 ENCOUNTER — Ambulatory Visit: Payer: Federal, State, Local not specified - PPO | Admitting: Obstetrics and Gynecology

## 2012-03-31 ENCOUNTER — Other Ambulatory Visit: Payer: Self-pay | Admitting: Obstetrics and Gynecology

## 2012-03-31 VITALS — BP 110/64 | Wt 171.0 lb

## 2012-03-31 DIAGNOSIS — O26849 Uterine size-date discrepancy, unspecified trimester: Secondary | ICD-10-CM

## 2012-03-31 DIAGNOSIS — Z331 Pregnant state, incidental: Secondary | ICD-10-CM

## 2012-03-31 LAB — US OB TRANSVAGINAL

## 2012-03-31 NOTE — Progress Notes (Signed)
[redacted]w[redacted]d Here for [place] for viability--FHR 140. On Crinone progesterone gel due to hx of 2 SABs. Schedule for NOB  W/u with CNM in 3 weeks--will get dirty urine today for GC, chlamydia, and will do NOB labs today. Will need clean catch UA at NOB w/u.

## 2012-03-31 NOTE — Addendum Note (Signed)
Addended by: Janeece Agee on: 03/31/2012 10:48 AM   Modules accepted: Orders

## 2012-03-31 NOTE — Progress Notes (Signed)
[redacted]w[redacted]d Pt needs nurse interview.  Ultrasound: singleton pregnancy, [redacted]w[redacted]d IUP, normal ovaries, no fluid in CDS, normal adenexas

## 2012-04-01 ENCOUNTER — Encounter: Payer: Self-pay | Admitting: Obstetrics and Gynecology

## 2012-04-01 DIAGNOSIS — O99119 Other diseases of the blood and blood-forming organs and certain disorders involving the immune mechanism complicating pregnancy, unspecified trimester: Secondary | ICD-10-CM | POA: Insufficient documentation

## 2012-04-01 DIAGNOSIS — D696 Thrombocytopenia, unspecified: Secondary | ICD-10-CM

## 2012-04-01 LAB — PRENATAL PANEL VII
Antibody Screen: NEGATIVE
Basophils Relative: 0 % (ref 0–1)
Eosinophils Absolute: 0.1 10*3/uL (ref 0.0–0.7)
Hepatitis B Surface Ag: NEGATIVE
MCH: 29 pg (ref 26.0–34.0)
MCHC: 33.3 g/dL (ref 30.0–36.0)
Neutrophils Relative %: 59 % (ref 43–77)
Platelets: 88 10*3/uL — ABNORMAL LOW (ref 150–400)
RDW: 12.8 % (ref 11.5–15.5)

## 2012-04-01 LAB — GC/CHLAMYDIA PROBE AMP, URINE
Chlamydia, Swab/Urine, PCR: NEGATIVE
GC Probe Amp, Urine: NEGATIVE

## 2012-04-20 ENCOUNTER — Encounter: Payer: Self-pay | Admitting: Certified Nurse Midwife

## 2012-04-21 ENCOUNTER — Ambulatory Visit: Payer: Federal, State, Local not specified - PPO | Admitting: Certified Nurse Midwife

## 2012-04-21 VITALS — BP 110/62 | Wt 169.0 lb

## 2012-04-21 DIAGNOSIS — Z331 Pregnant state, incidental: Secondary | ICD-10-CM

## 2012-04-21 NOTE — Progress Notes (Signed)
   Natasha Fields is being seen today for her first obstetrical visit at [redacted]w[redacted]d gestation by LMP and confirmed with a [redacted]w[redacted]d U/S.  She reports feeling well with no complaints.  Discussed her low platelet count and the rec to see Hematology.  See voiced understanding and she was told she could make her appt at checkout.  She is currently on Crinone gel 8% d/t her history of SABs. Pt reports that is going well.  Her obstetrical history is significant for: Patient Active Problem List  Diagnosis  . Blood type, Rh negative  . H/O congenital dislocation of hip  . Hx Pectora excavatum  . SAB (spontaneous abortion) x 2  . H/O infertility  . Thrombocytopenia complicating pregnancy    Relationship with FOB:  Married; Arlys John  She is not employed  Feeding plan:   Breastfeeding  Pregnancy history fully reviewed.  The following portions of the patient's history were reviewed and updated as appropriate: allergies, current medications, past family history, past medical history, past social history, past surgical history and problem list.  Review of Systems Pertinent ROS is described in HPI   Objective:   BP 110/62  Wt 169 lb (76.658 kg)  BMI 29.94 kg/m2  LMP 02/10/2012 Wt Readings from Last 1 Encounters:  04/21/12 169 lb (76.658 kg)   BMI: Body mass index is 29.94 kg/(m^2).  CBC    Component Value Date/Time   WBC 5.5 03/31/2012 1004   RBC 4.65 03/31/2012 1004   HGB 13.5 03/31/2012 1004   HCT 40.5 03/31/2012 1004   PLT 88* 03/31/2012 1004   MCV 87.1 03/31/2012 1004   MCH 29.0 03/31/2012 1004   MCHC 33.3 03/31/2012 1004   RDW 12.8 03/31/2012 1004   LYMPHSABS 1.9 03/31/2012 1004   MONOABS 0.4 03/31/2012 1004   EOSABS 0.1 03/31/2012 1004   BASOSABS 0.0 03/31/2012 1004   General: alert, cooperative and no distress HEENT: grossly normal  Thyroid: normal  Respiratory: clear to auscultation bilaterally Cardiovascular: regular rate and rhythm,  Breasts:  No dominant masses, nipples erect Gastrointestinal:  soft, non-tender; no masses,  no organomegaly Extremities: extremities normal, no pain or edema  Pelvic Exam: Deferred; last pap 02/2012 UTERUS: gravid and consistent with 10 weeks OB EXAM PELVIMETRY: appears adequate, proven to 8#12  FHR:  150  bpm  Assessment:    Pregnancy at  10w 1d Thrombocytopenia - will make an appt with Hematology H/o SABs x2 - on progesterone therapy  Plan:   Prenatal panel reviewed and discussed with the patient:  yes  Pap smear collected:  No (see above)  GC/Chlamydia collected:  no Wet prep:  N/a  Discussion of Genetic testing options: unknown at this time Prenatal vitamins recommended  Next visit:  4 weeks for ROB  Other anticipated f/u:  Hematology asap for thrombocytopenia   Haroldine Laws, CNM, MSN

## 2012-04-21 NOTE — Progress Notes (Signed)
Pt stated she is nausea and tired. Pt stated no other issues .

## 2012-04-26 ENCOUNTER — Telehealth: Payer: Self-pay | Admitting: Oncology

## 2012-04-26 NOTE — Telephone Encounter (Signed)
S/W PT IN RE NP APPT 03/26 @ 10:30 W/DR. SHADAD.  REFERRING DR. Kirkland Hun DX- LOW PLTS WELCOME PACKET MAILED.

## 2012-04-26 NOTE — Telephone Encounter (Signed)
C/D 04/26/12 for appt. 05/18/12 °

## 2012-05-13 ENCOUNTER — Other Ambulatory Visit: Payer: Self-pay | Admitting: Oncology

## 2012-05-13 DIAGNOSIS — D696 Thrombocytopenia, unspecified: Secondary | ICD-10-CM

## 2012-05-18 ENCOUNTER — Ambulatory Visit (HOSPITAL_BASED_OUTPATIENT_CLINIC_OR_DEPARTMENT_OTHER): Payer: Federal, State, Local not specified - PPO | Admitting: Oncology

## 2012-05-18 ENCOUNTER — Other Ambulatory Visit (HOSPITAL_BASED_OUTPATIENT_CLINIC_OR_DEPARTMENT_OTHER): Payer: Federal, State, Local not specified - PPO | Admitting: Lab

## 2012-05-18 ENCOUNTER — Encounter: Payer: Self-pay | Admitting: Oncology

## 2012-05-18 ENCOUNTER — Ambulatory Visit: Payer: Federal, State, Local not specified - PPO

## 2012-05-18 VITALS — BP 136/57 | HR 106 | Temp 98.0°F | Resp 20 | Ht 63.0 in | Wt 171.8 lb

## 2012-05-18 DIAGNOSIS — O99119 Other diseases of the blood and blood-forming organs and certain disorders involving the immune mechanism complicating pregnancy, unspecified trimester: Secondary | ICD-10-CM

## 2012-05-18 DIAGNOSIS — D696 Thrombocytopenia, unspecified: Secondary | ICD-10-CM

## 2012-05-18 DIAGNOSIS — D689 Coagulation defect, unspecified: Secondary | ICD-10-CM

## 2012-05-18 LAB — COMPREHENSIVE METABOLIC PANEL (CC13)
ALT: 12 U/L (ref 0–55)
AST: 13 U/L (ref 5–34)
Albumin: 3 g/dL — ABNORMAL LOW (ref 3.5–5.0)
BUN: 12.8 mg/dL (ref 7.0–26.0)
Calcium: 9.4 mg/dL (ref 8.4–10.4)
Chloride: 107 mEq/L (ref 98–107)
Potassium: 4 mEq/L (ref 3.5–5.1)
Sodium: 136 mEq/L (ref 136–145)
Total Protein: 6.7 g/dL (ref 6.4–8.3)

## 2012-05-18 LAB — CBC WITH DIFFERENTIAL/PLATELET
Basophils Absolute: 0 10*3/uL (ref 0.0–0.1)
EOS%: 1.2 % (ref 0.0–7.0)
Eosinophils Absolute: 0.1 10*3/uL (ref 0.0–0.5)
HGB: 12.8 g/dL (ref 11.6–15.9)
MCH: 30 pg (ref 25.1–34.0)
NEUT#: 5.1 10*3/uL (ref 1.5–6.5)
RBC: 4.26 10*6/uL (ref 3.70–5.45)
RDW: 13.2 % (ref 11.2–14.5)
lymph#: 1.6 10*3/uL (ref 0.9–3.3)

## 2012-05-18 NOTE — Progress Notes (Signed)
Note dictated

## 2012-05-18 NOTE — Progress Notes (Signed)
Checked in new pt with no financial concerns. °

## 2012-05-19 NOTE — Progress Notes (Signed)
CC:   Janine Limbo, M.D. Synetta Fail, MD  REASON FOR CONSULTATION:  Thrombocytopenia.  HISTORY OF PRESENT ILLNESS:  Mrs. Bir is a pleasant 32 year old woman, currently of [place], originally from [place], grew up in [place] for a while and attended [employer].  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  She is currently about 14 weeks of her 3rd pregnancy, and was referred to me for evaluation for thrombocytopenia.  She had reported that her previous pregnancies were really uncomplicated.  Her first pregnancy was about 4 years ago.  She had a vaginal delivery without any complication and did use epidural anesthesia.  Her second delivery was about 20 months ago. Again, it was uncomplicated and did not require epidural anesthesia.  As mentioned, she is about 14 weeks' pregnant.  She had had 3 miscarriages in the past and it was attributed to a hormonal deficiency and she receives progesterone injections to support her pregnancy and that had been the case for the last 2 pregnancies and this current one.  She had her first OB visits on February 6, and her platelet counts were 88,000. Her hemoglobin was normal at 13.5, white cell count was 5.5. Previously, in looking back at her records back in July 2012, her platelet count was 134.  Previously it was 160.  She has never really been told that she has had any problems with thrombocytopenia in the past.  Clinically, she feels well.  She has really no symptoms at this point.  She does not report any bleeding.  She does not report any epistaxis.  She does not report any hematochezia.  She does not report any melena.  She does not report any petechiae.  She had not had any easy bruisability.  She denied any other medication.  She had been on allergy medication, but had stopped when she got pregnant.  She, again other than the progesterone supplement, really is not on anything.  REVIEW OF  SYSTEMS:  Does not report any headaches, blurry vision, double vision.  Does not report any motor or sensory neuropathy.  Does not report any alteration in mental status.  Does not report any psychiatric issues, depression.  Does not report any fever, chills, sweats.  Does not report any cough, hemoptysis, hematemesis.  No nausea, vomiting. Does not report any abdominal pain.  No hematochezia.  No melena.  No genitourinary complaints.  No frequency, urgency or hesitancy.  No musculoskeletal complaints.  No arthralgias, myalgias.  No heat or cold intolerance.  No bleeding or clotting diathesis.  The rest of the review of systems is unremarkable.  PAST MEDICAL HISTORY:  Significant for spontaneous abortions in the past.  She has no history of hypertension, diabetes.  She does have seasonal allergies.  MEDICATIONS:  Currently, on prenatal vitamins.  ALLERGIES:  None.  SOCIAL HISTORY:  She is married.  She has 2 children and she is pregnant with a 3rd.  She denied any alcohol or tobacco abuse.  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  FAMILY HISTORY:  Father has presumably high blood pressure.  Mother is rather healthy.  She has 3 brothers who are healthy.  There is really no family history of any blood disorders.  No family history of any thrombosis and no history of any recurrent miscarriages.  PHYSICAL EXAMINATION:  General:  An alert, awake, pleasant woman who appeared in no active distress.  Vital signs:  Her blood pressure today is 136/57, pulse 100,  respirations 20, temperature is 98.  She weighs 171 pounds.  ECOG performance status is 0.  HEENT:  Head is normocephalic, atraumatic.  Pupils equal, round, reactive to light. Oral mucosa moist and pink.  Neck:  Supple without lymphadenopathy. Heart:  Regular rate and rhythm.  S1, S2.  Lungs:  Clear to auscultation.  No rhonchi or wheeze or dullness to percussion.  Abdomen: Soft, nontender.  No hepatosplenomegaly.  Extremities:  No  clubbing, cyanosis, or edema.  Neurologic:  Intact motor and sensory and deep tendon reflexes.  LABORATORY DATA:  Laboratory data today showed a hemoglobin of 12, white count 7.3, platelet count of 186,000.  Peripheral smear was personally reviewed today.  There are really no platelet abnormalities at this time.  ASSESSMENT AND PLANS:  A 32 year old woman with thrombocytopenia that is detected on a lab test that was done on February 6 with platelet count of 88,000.  In looking at her previous records, her platelet counts have been always normal, although on 2 separate occasions in 2009 and 2011, the platelets were clumped and were not able to obtain a count.  Her platelet counts today are perfectly normal at 186 with a normal healthy smear.  Differential diagnosis was discussed today with Mrs. Pitera regarding thrombocytopenia.  One can wonder if this is a gestational thrombocytopenia versus more of a platelet clumping and a surreptitious drop in her platelet count is under 88,000.  I  lean toward this is probably more platelet clumping than gestational thrombocytopenia, given the fact that her platelet counts are perfectly normal today.  She has had a tendency for her platelets to clump, which really underestimates how much of platelets she has.  At this point, I just do not really see any intervention is needed.  She does not really require any platelet transfusion or corticosteroids or any further workup.  I do recommend having a routine CBC checked on a regular basis.  Obviously, if she is requiring epidural anesthesia for her delivery, a recheck of her platelet counts prior to that, but at this point, I do not anticipate that she will have a problem with epidural anesthesia or thrombocytopenia in general, as I feel this is probably more of a surreptitious finding of a thrombocytopenia.  All of her questions were answered today.  No routine followup will be scheduled, but I will  be happy to see Mrs. Dann any time needed in the future.    ______________________________ Benjiman Core, M.D. FNS/MEDQ  D:  05/18/2012  T:  05/19/2012  Job:  [id number]

## 2012-11-12 ENCOUNTER — Encounter (HOSPITAL_COMMUNITY): Payer: Self-pay | Admitting: *Deleted

## 2012-11-12 ENCOUNTER — Inpatient Hospital Stay (HOSPITAL_COMMUNITY)
Admission: AD | Admit: 2012-11-12 | Discharge: 2012-11-14 | DRG: 373 | Disposition: A | Discharge status: Home / Self Care | Payer: Federal, State, Local not specified - PPO | Source: Ambulatory Visit | Attending: Obstetrics and Gynecology | Admitting: Obstetrics and Gynecology

## 2012-11-12 DIAGNOSIS — D689 Coagulation defect, unspecified: Secondary | ICD-10-CM | POA: Diagnosis present

## 2012-11-12 DIAGNOSIS — D696 Thrombocytopenia, unspecified: Secondary | ICD-10-CM | POA: Diagnosis present

## 2012-11-12 MED ORDER — LIDOCAINE HCL (PF) 1 % IJ SOLN
INTRAMUSCULAR | Status: AC
  Administered 2012-11-12: 30 mL
  Filled 2012-11-12: qty 30

## 2012-11-12 MED ORDER — OXYTOCIN 10 UNIT/ML IJ SOLN
INTRAMUSCULAR | Status: AC
  Filled 2012-11-12: qty 2

## 2012-11-12 MED ORDER — OXYTOCIN 40 UNITS IN LACTATED RINGERS INFUSION - SIMPLE MED
INTRAVENOUS | Status: AC
  Administered 2012-11-12: 40 [IU]
  Filled 2012-11-12: qty 1000

## 2012-11-12 NOTE — MAU Note (Signed)
Pt came in contracting. Midwife at bedside. Pt complete. Per midwife states transfer to birthing suites

## 2012-11-13 ENCOUNTER — Encounter (HOSPITAL_COMMUNITY): Payer: Self-pay | Admitting: *Deleted

## 2012-11-13 LAB — CBC
MCV: 85.6 fL (ref 78.0–100.0)
Platelets: 143 10*3/uL — ABNORMAL LOW (ref 150–400)
RBC: 4.02 MIL/uL (ref 3.87–5.11)
RDW: 12.2 % (ref 11.5–15.5)
WBC: 12.5 10*3/uL — ABNORMAL HIGH (ref 4.0–10.5)

## 2012-11-13 MED ORDER — DIPHENHYDRAMINE HCL 25 MG PO CAPS: 25.0000 mg | ORAL_CAPSULE | Freq: Four times a day (QID) | ORAL | Status: DC | PRN

## 2012-11-13 MED ORDER — OXYCODONE-ACETAMINOPHEN 5-325 MG PO TABS: 1.0000 | ORAL_TABLET | ORAL | Status: DC | PRN

## 2012-11-13 MED ORDER — IBUPROFEN 600 MG PO TABS
600.0000 mg | ORAL_TABLET | Freq: Four times a day (QID) | ORAL | Status: DC
  Administered 2012-11-13 – 2012-11-14 (×6): 600 mg via ORAL
  Filled 2012-11-13 (×5): qty 1

## 2012-11-13 MED ORDER — ZOLPIDEM TARTRATE 5 MG PO TABS: 5.0000 mg | ORAL_TABLET | Freq: Every evening | ORAL | Status: DC | PRN

## 2012-11-13 MED ORDER — PRENATAL MULTIVITAMIN CH
1.0000 | ORAL_TABLET | Freq: Every day | ORAL | Status: DC
  Administered 2012-11-13: 1 via ORAL
  Filled 2012-11-13: qty 1

## 2012-11-13 MED ORDER — ONDANSETRON HCL 4 MG PO TABS: 4.0000 mg | ORAL_TABLET | ORAL | Status: DC | PRN

## 2012-11-13 MED ORDER — SENNOSIDES-DOCUSATE SODIUM 8.6-50 MG PO TABS
2.0000 | ORAL_TABLET | ORAL | Status: DC
  Administered 2012-11-14: 2 via ORAL

## 2012-11-13 MED ORDER — SIMETHICONE 80 MG PO CHEW: 80.0000 mg | CHEWABLE_TABLET | ORAL | Status: DC | PRN

## 2012-11-13 MED ORDER — TETANUS-DIPHTH-ACELL PERTUSSIS 5-2.5-18.5 LF-MCG/0.5 IM SUSP: 0.5000 mL | Freq: Once | INTRAMUSCULAR | Status: DC

## 2012-11-13 MED ORDER — WITCH HAZEL-GLYCERIN EX PADS: 1.0000 "application " | MEDICATED_PAD | CUTANEOUS | Status: DC | PRN

## 2012-11-13 MED ORDER — LANOLIN HYDROUS EX OINT: TOPICAL_OINTMENT | CUTANEOUS | Status: DC | PRN

## 2012-11-13 MED ORDER — BENZOCAINE-MENTHOL 20-0.5 % EX AERO
1.0000 "application " | INHALATION_SPRAY | CUTANEOUS | Status: DC | PRN
  Administered 2012-11-13: 1 via TOPICAL
  Filled 2012-11-13: qty 56

## 2012-11-13 MED ORDER — DIBUCAINE 1 % RE OINT: 1.0000 "application " | TOPICAL_OINTMENT | RECTAL | Status: DC | PRN

## 2012-11-13 MED ORDER — RHO D IMMUNE GLOBULIN 1500 UNIT/2ML IJ SOLN
300.0000 ug | Freq: Once | INTRAMUSCULAR | Status: AC
  Administered 2012-11-13: 300 ug via INTRAMUSCULAR
  Filled 2012-11-13: qty 2

## 2012-11-13 MED ORDER — FERROUS SULFATE 325 (65 FE) MG PO TABS
325.0000 mg | ORAL_TABLET | Freq: Two times a day (BID) | ORAL | Status: DC
  Administered 2012-11-13 – 2012-11-14 (×3): 325 mg via ORAL
  Filled 2012-11-13 (×3): qty 1

## 2012-11-13 MED ORDER — ONDANSETRON HCL 4 MG/2ML IJ SOLN: 4.0000 mg | INTRAMUSCULAR | Status: DC | PRN

## 2012-11-13 NOTE — Progress Notes (Signed)
Pt received from MAU via stretcher accompanied by J. Oxley, CNM. VE complete.

## 2012-11-13 NOTE — H&P (Signed)
Natasha Fields is a 32 y.o. female, [id number] at 29 weeks, presenting for active labor and complete.  Denies recent fever, resp or GI c/o's, UTI or PIH s/s. GFM.   Patient Active Problem List   Diagnosis Date Noted  . Thrombocytopenia complicating pregnancy 04/01/2012  . Blood type, Rh negative 03/16/2012  . H/O congenital dislocation of hip 03/16/2012  . 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 SAB (spontaneous abortion) x 2 03/16/2012  . H/O infertility 03/16/2012    History of present pregnancy: Patient entered care at 10 weeks.   EDC of 11/16/12 was established by LMP.   Anatomy scan:  18 weeks, with normal findings and an anterior placenta.   Additional [place] evaluations:  none.   Significant prenatal events:  CBC 11/04/12 at  38 weeks - Platelets were 93   Last evaluation:  11/11/12 at [redacted]w[redacted]d    3 cm / 70% / -2  OB History   Grav Para Term Preterm Abortions TAB SAB Ect Mult Living   5 2 2  0 2 0 2 0 0 2     Past Medical History  Diagnosis Date  . No pertinent past medical history    Past Surgical History  Procedure Laterality Date  . Wisdom tooth extraction     Family History: family history includes Alzheimer's disease in her maternal grandmother; Arthritis in her maternal grandmother and mother; Emphysema in her maternal grandfather; Heart disease in her paternal grandfather; Hypertension in her father and paternal grandfather; Osteoporosis in her maternal grandmother; Scoliosis in her maternal grandmother; Skin cancer in her paternal grandmother; Stroke in her paternal grandfather; Thyroid disease in her maternal grandfather. Social History:  reports that she has never smoked. She has never used smokeless tobacco. She reports that she does not drink alcohol or use illicit drugs.   Prenatal Transfer Tool  Maternal Diabetes: No Genetic Screening: Declined Maternal Ultrasounds/Referrals: Normal Fetal Ultrasounds or other Referrals:  None Maternal Substance Abuse:  No Significant  Maternal Medications:  None Significant Maternal Lab Results: Lab values include: Group B Strep negative, Rh negative    ROS: see HPI above, all other systems are negative  No Known Allergies   Dilation: 10 Exam by:: J. Udell Blasingame, CNM Height 5\' 4"  (1.626 m), weight 200 lb (90.719 kg), last menstrual period 02/10/2012, not currently breastfeeding.  Chest clear Heart RRR without murmur Abd gravid, NT Ext: WNL  FHR: Unable to trace prior to delivery UCs:  Appears Q 2-3 min  Prenatal labs: ABO, Rh: A/NEG/-- (02/06 1004) Antibody: NEG (02/06 1004) Rubella:   Immune RPR: NON REAC (02/06 1004)  HBsAg: NEGATIVE (02/06 1004)  HIV: NON REACTIVE (02/06 1004)  GBS: Negative (08/26 0000) Sickle cell/Hgb electrophoresis:  n/a Pap:  03/16/12 WNL GC:  Neg Chlamydia:  Neg Genetic screenings:  Declined Glucola:  85 Other:  Smear on 05/18/12    Assessment/Plan: IUP at [redacted]w[redacted]d Active labor/complete and involuntary pushing GBS neg Thrombocytopenia  Admit to BS per c/w Dr. Richardson Dopp as attending MD Routine orders   Rowan Blase, MSN 11/13/2012, 12:22 AM

## 2012-11-13 NOTE — Lactation Note (Signed)
This note was copied from the chart of [facility]. Lactation Consultation Note  Patient Name: Natasha Fields [id number] Date: 11/13/2012 Reason for consult: Initial assessment Mom is experienced BF. She reports some cracking and bleeding on the right nipple after the baby BF earlier today. Mom reports she had company, felt the baby become shallow with the latch but did not adjust baby which caused the nipple trauma. Mom had baby latched to the right breast when I arrived at this visit. Baby appeared to have deep latch, lips well flanged and was demonstrating a good rhythmic suck. Mom reports the latch feels better. Mom reports baby is latching well to left breast. Basic teaching reviewed with Mom. Encouraged to continue to que base BF, cluster feeding discussed. Care for sore nipples reviewed, comfort gels given with instructions. Lactation brochure left for review, advised of OP services and support group. Advised to call LC if she would like assist.  Maternal Data Formula Feeding for Exclusion: No Infant to breast within first hour of birth: Yes Has patient been taught Hand Expression?: No (Mom reports she knows how to hand express) Does the patient have breastfeeding experience prior to this delivery?: Yes  Feeding Feeding Type: Breast Milk Length of feed: 15 min  LATCH Score/Interventions Latch: Grasps breast easily, tongue down, lips flanged, rhythmical sucking.  Audible Swallowing: A few with stimulation Intervention(s): Hand expression  Type of Nipple: Everted at rest and after stimulation  Comfort (Breast/Nipple): Filling, red/small blisters or bruises, mild/mod discomfort (right breast)  Problem noted: Cracked, bleeding, blisters, bruises;Mild/Moderate discomfort Interventions  (Cracked/bleeding/bruising/blister): Expressed breast milk to nipple Interventions (Mild/moderate discomfort): Comfort gels  Hold (Positioning): No assistance needed to correctly position  infant at breast.  LATCH Score: 8  Lactation Tools Discussed/Used Tools: Comfort gels   Consult Status Consult Status: Follow-up Date: 11/14/12 Follow-up type: In-patient    Alfred Levins 11/13/2012, 5:48 PM

## 2012-11-13 NOTE — Progress Notes (Signed)
Post Partum Day 1:S/P SVB, 2nd degree laceration Subjective: Patient up ad lib, denies syncope or dizziness. Feeding:  Breast Contraceptive plan:   Undecided at present  Objective: Blood pressure 105/69, pulse 68, temperature 98 F (36.7 C), temperature source Oral, resp. rate 18, height 5\' 4"  (1.626 m), weight 200 lb (90.719 kg), last menstrual period 02/10/2012, SpO2 98.00%, unknown if currently breastfeeding.  Physical Exam:  General: alert Lochia: appropriate Uterine Fundus: firm Incision: healing well DVT Evaluation: No evidence of DVT seen on physical exam. Negative Homan's sign.   Recent Labs  11/13/12 0613  HGB 11.6*  HCT 34.4*  Platelets 143 (186 in March)  Assessment/Plan: S/P Vaginal delivery day 1 Hx thrombocytopenia--platelets stable Continue current care Plan for discharge tomorrow If peds OK for baby d/c later today, I will d/c patient then.   LOS: 1 day   Bellatrix Devonshire, Chip Boer 11/13/2012, 7:50 AM

## 2012-11-14 LAB — RH IG WORKUP (INCLUDES ABO/RH)
Fetal Screen: NEGATIVE
Gestational Age(Wks): 39.3
Unit division: 0

## 2012-11-14 MED ORDER — IBUPROFEN 600 MG PO TABS: 600.0000 mg | ORAL_TABLET | Freq: Four times a day (QID) | ORAL | Status: AC

## 2012-11-14 NOTE — Lactation Note (Signed)
This note was copied from the chart of [facility]. Lactation Consultation Note Mom states baby is breast feeding better; states she is being more careful with positioning baby to protect the nipple, states football hold works better on the right side and the latch is more comfortable. Mom states baby cluster fed last night. Enc mom to call the lactation office if she has any concerns, and to attend the BFSG.  Patient Name: Natasha Fields [id number] Date: 11/14/2012     Maternal Data    Feeding Feeding Type: Breast Milk Length of feed: 60 min  LATCH Score/Interventions                      Lactation Tools Discussed/Used     Consult Status      Natasha Fields 11/14/2012, 9:23 AM

## 2012-11-15 NOTE — Discharge Summary (Signed)
   Obstetric Discharge Summary Reason for Admission: onset of labor Prenatal Procedures: none Intrapartum Procedures: spontaneous vaginal delivery Postpartum Procedures: none Complications-Operative and Postpartum: 2nd degree perineal laceration  Temp:  [98.4 F (36.9 C)] 98.4 F (36.9 C) (09/22 0547) Pulse Rate:  [84] 84 (09/22 0547) Resp:  [18] 18 (09/22 0547) BP: (113)/(72) 113/72 mmHg (09/22 0547) HGB  Date Value Range Status  05/18/2012 12.8  11.6 - 15.9 g/dL Final     Hemoglobin  Date Value Range Status  11/13/2012 11.6* 12.0 - 15.0 g/dL Final     HCT  Date Value Range Status  11/13/2012 34.4* 36.0 - 46.0 % Final  05/18/2012 36.9  34.8 - 46.6 % Final    Hospital Course:  Hospital Course: Admitted in labor fully dilated, . Delivery was performed by Haroldine Laws without difficulty. Patient and baby tolerated the procedure without difficulty, with a 2nd laceration noted. Infant to FTN. Mother and infant then had an uncomplicated postpartum course, with breast feeding going well. Mom's physical exam was WNL, and she was discharged home in stable condition. Contraception plan was undecided.  She received adequate benefit from po pain medications.  Discharge Diagnoses: Term Pregnancy-delivered and thrombocytopenia  Discharge Information: Date: 11/15/2012 Activity: pelvic rest Diet: routine Medications:    Medication List         calcium carbonate 500 MG chewable tablet  Commonly known as:  TUMS - dosed in mg elemental calcium  Chew 2 tablets by mouth daily as needed for heartburn.     ibuprofen 600 MG tablet  Commonly known as:  ADVIL,MOTRIN  Take 1 tablet (600 mg total) by mouth every 6 (six) hours.     prenatal vitamin w/FE, FA 27-1 MG Tabs tablet  Take 1 tablet by mouth daily.       Condition: stable Instructions: refer to practice specific booklet Discharge to home   Newborn Data: Live born  Information for the patient's newborn:  Kamylah, Manzo  [[id number]]  female 8/8; APGAR , ; weight ;7lb 6.7oz  Home with mother.  Josephene Marrone P 11/15/2012, 12:51 AM

## 2013-12-25 ENCOUNTER — Encounter (HOSPITAL_COMMUNITY): Payer: Self-pay | Admitting: *Deleted

## 2014-08-17 LAB — OB RESULTS CONSOLE RPR: RPR: NONREACTIVE

## 2014-08-17 LAB — OB RESULTS CONSOLE RUBELLA ANTIBODY, IGM: Rubella: IMMUNE

## 2014-08-17 LAB — OB RESULTS CONSOLE HIV ANTIBODY (ROUTINE TESTING): HIV: NONREACTIVE

## 2014-08-17 LAB — OB RESULTS CONSOLE GC/CHLAMYDIA
Chlamydia: NEGATIVE
Gonorrhea: NEGATIVE

## 2014-08-17 LAB — OB RESULTS CONSOLE ABO/RH: RH Type: NEGATIVE

## 2014-08-17 LAB — OB RESULTS CONSOLE ANTIBODY SCREEN: ANTIBODY SCREEN: NEGATIVE

## 2014-08-17 LAB — OB RESULTS CONSOLE HEPATITIS B SURFACE ANTIGEN: Hepatitis B Surface Ag: NEGATIVE

## 2015-02-06 LAB — OB RESULTS CONSOLE GBS: GBS: NEGATIVE

## 2015-02-24 NOTE — L&D Delivery Note (Signed)
Final Labor Progress Note At 1400 VE 5/50/-2.  FHR remained reassuring with occasional variable decelerations.  Vaginal Delivery Note Spontaneous rupture of membranes today, at 1311, clear.  GBS negative.  Cervical dilation was complete at  1430.   Self directed pushing began at  1435.   After 30 seconds of pushing the head, shoulders and the body of a viable female infant "Kara Meadmma" delivered spontaneously with maternal effort in the LOA position at 1435.    With vigorous tone and spontaneous cry, the infant was placed on moms abd.  After the umbilical cord was clamped it was cut by the FOB, then cord blood was obtained for evaluation.  Spontaneous delivery of a intact placenta with a 3 vessel cord via Shultz at  1503.   Episiotomy: None   The vulva, perineum, vaginal vault, rectum and cervix were inspected and revealed a 2nd degree perineal, repaired using a 3-0 vicryl on a CT needle  with 25cc of 1% lidocaine.  The rectum sphincter intact after the repair.   Patient tolerated repair well.   Postpartum pitocin as ordered.  Uterus boggy immediately after delivery of placenta - fundal massage and clots expressed.  Shortly after fundus became firm, bleeding under control.   EBL 150, Pt hemodynamically stable.   Sponge, laps and needle count correct and verified with the primary care nurse.  Attending MD available at all times.    Routine postpartum orders Husband had a vescetomy for contraception  Mom plans to breastfeed  Placenta to pathology: NO     Cord Gases sent to lab: NO Cord blood sent to lab: YES   APGARS:  9 at 1 minute and 9 at 5 minutes Weight:.      Both mom and baby were left in stable condition, baby skin to skin.      Sheyann Sulton, CNM, MSN 03/06/2015. 4:02 PM

## 2015-03-05 ENCOUNTER — Encounter (HOSPITAL_COMMUNITY): Payer: Self-pay | Admitting: *Deleted

## 2015-03-05 ENCOUNTER — Telehealth (HOSPITAL_COMMUNITY): Payer: Self-pay | Admitting: *Deleted

## 2015-03-05 ENCOUNTER — Other Ambulatory Visit: Payer: Self-pay | Admitting: Obstetrics and Gynecology

## 2015-03-05 NOTE — Telephone Encounter (Signed)
Preadmission screen  

## 2015-03-06 ENCOUNTER — Inpatient Hospital Stay (HOSPITAL_COMMUNITY)
Admission: RE | Admit: 2015-03-06 | Discharge: 2015-03-07 | DRG: 775 | Disposition: A | Discharge status: Home / Self Care | Payer: Federal, State, Local not specified - PPO | Source: Ambulatory Visit | Attending: Obstetrics and Gynecology | Admitting: Obstetrics and Gynecology

## 2015-03-06 ENCOUNTER — Encounter (HOSPITAL_COMMUNITY): Payer: Self-pay

## 2015-03-06 DIAGNOSIS — Z808 Family history of malignant neoplasm of other organs or systems: Secondary | ICD-10-CM | POA: Diagnosis not present

## 2015-03-06 DIAGNOSIS — O9912 Other diseases of the blood and blood-forming organs and certain disorders involving the immune mechanism complicating childbirth: Secondary | ICD-10-CM | POA: Diagnosis present

## 2015-03-06 DIAGNOSIS — Z825 Family history of asthma and other chronic lower respiratory diseases: Secondary | ICD-10-CM | POA: Diagnosis not present

## 2015-03-06 DIAGNOSIS — O99119 Other diseases of the blood and blood-forming organs and certain disorders involving the immune mechanism complicating pregnancy, unspecified trimester: Secondary | ICD-10-CM

## 2015-03-06 DIAGNOSIS — Z6791 Unspecified blood type, Rh negative: Secondary | ICD-10-CM | POA: Diagnosis present

## 2015-03-06 DIAGNOSIS — Z3A39 39 weeks gestation of pregnancy: Secondary | ICD-10-CM

## 2015-03-06 DIAGNOSIS — Z8249 Family history of ischemic heart disease and other diseases of the circulatory system: Secondary | ICD-10-CM

## 2015-03-06 DIAGNOSIS — D696 Thrombocytopenia, unspecified: Secondary | ICD-10-CM | POA: Diagnosis present

## 2015-03-06 DIAGNOSIS — Z820 Family history of epilepsy and other diseases of the nervous system: Secondary | ICD-10-CM | POA: Diagnosis not present

## 2015-03-06 LAB — TYPE AND SCREEN
ABO/RH(D): A NEG
ANTIBODY SCREEN: NEGATIVE

## 2015-03-06 LAB — CBC
HEMATOCRIT: 35 % — AB (ref 36.0–46.0)
HEMOGLOBIN: 11.5 g/dL — AB (ref 12.0–15.0)
MCH: 27.9 pg (ref 26.0–34.0)
MCHC: 32.9 g/dL (ref 30.0–36.0)
MCV: 85 fL (ref 78.0–100.0)
Platelets: ADEQUATE 10*3/uL (ref 150–400)
RBC: 4.12 MIL/uL (ref 3.87–5.11)
RDW: 13 % (ref 11.5–15.5)
WBC: 7.6 10*3/uL (ref 4.0–10.5)

## 2015-03-06 LAB — PLATELET COUNT: Platelets: 213 10*3/uL (ref 150–400)

## 2015-03-06 MED ORDER — LIDOCAINE HCL (PF) 1 % IJ SOLN
30.0000 mL | INTRAMUSCULAR | Status: DC | PRN
  Administered 2015-03-06: 30 mL via SUBCUTANEOUS
  Filled 2015-03-06 (×2): qty 30

## 2015-03-06 MED ORDER — OXYCODONE-ACETAMINOPHEN 5-325 MG PO TABS: 1.0000 | ORAL_TABLET | ORAL | Status: DC | PRN

## 2015-03-06 MED ORDER — OXYTOCIN 10 UNIT/ML IJ SOLN: 2.5000 [IU]/h | INTRAVENOUS | Status: DC

## 2015-03-06 MED ORDER — ACETAMINOPHEN 325 MG PO TABS: 650.0000 mg | ORAL_TABLET | ORAL | Status: DC | PRN

## 2015-03-06 MED ORDER — ONDANSETRON HCL 4 MG PO TABS: 4.0000 mg | ORAL_TABLET | ORAL | Status: DC | PRN

## 2015-03-06 MED ORDER — LACTATED RINGERS IV SOLN
INTRAVENOUS | Status: DC
  Administered 2015-03-06: 10:00:00 via INTRAVENOUS

## 2015-03-06 MED ORDER — PRENATAL MULTIVITAMIN CH
1.0000 | ORAL_TABLET | Freq: Every day | ORAL | Status: DC
  Administered 2015-03-07: 1 via ORAL
  Filled 2015-03-06: qty 1

## 2015-03-06 MED ORDER — ONDANSETRON HCL 4 MG/2ML IJ SOLN: 4.0000 mg | Freq: Four times a day (QID) | INTRAMUSCULAR | Status: DC | PRN

## 2015-03-06 MED ORDER — SENNOSIDES-DOCUSATE SODIUM 8.6-50 MG PO TABS
2.0000 | ORAL_TABLET | ORAL | Status: DC
  Administered 2015-03-07: 2 via ORAL
  Filled 2015-03-06: qty 2

## 2015-03-06 MED ORDER — ONDANSETRON HCL 4 MG/2ML IJ SOLN: 4.0000 mg | INTRAMUSCULAR | Status: DC | PRN

## 2015-03-06 MED ORDER — LANOLIN HYDROUS EX OINT: TOPICAL_OINTMENT | CUTANEOUS | Status: DC | PRN

## 2015-03-06 MED ORDER — CITRIC ACID-SODIUM CITRATE 334-500 MG/5ML PO SOLN: 30.0000 mL | ORAL | Status: DC | PRN

## 2015-03-06 MED ORDER — OXYTOCIN 10 UNIT/ML IJ SOLN
1.0000 m[IU]/min | INTRAMUSCULAR | Status: DC
  Administered 2015-03-06: 2 m[IU]/min via INTRAVENOUS
  Filled 2015-03-06: qty 4

## 2015-03-06 MED ORDER — OXYCODONE-ACETAMINOPHEN 5-325 MG PO TABS: 2.0000 | ORAL_TABLET | ORAL | Status: DC | PRN

## 2015-03-06 MED ORDER — SIMETHICONE 80 MG PO CHEW: 80.0000 mg | CHEWABLE_TABLET | ORAL | Status: DC | PRN

## 2015-03-06 MED ORDER — OXYTOCIN BOLUS FROM INFUSION
500.0000 mL | INTRAVENOUS | Status: DC
  Administered 2015-03-06: 500 mL via INTRAVENOUS

## 2015-03-06 MED ORDER — NALBUPHINE HCL 10 MG/ML IJ SOLN
5.0000 mg | INTRAMUSCULAR | Status: DC | PRN
Start: 2015-03-06 — End: 2015-03-07

## 2015-03-06 MED ORDER — IBUPROFEN 600 MG PO TABS
600.0000 mg | ORAL_TABLET | Freq: Four times a day (QID) | ORAL | Status: DC
  Administered 2015-03-06 – 2015-03-07 (×4): 600 mg via ORAL
  Filled 2015-03-06 (×4): qty 1

## 2015-03-06 MED ORDER — WITCH HAZEL-GLYCERIN EX PADS: 1.0000 "application " | MEDICATED_PAD | CUTANEOUS | Status: DC | PRN

## 2015-03-06 MED ORDER — BENZOCAINE-MENTHOL 20-0.5 % EX AERO
1.0000 "application " | INHALATION_SPRAY | CUTANEOUS | Status: DC | PRN
  Filled 2015-03-06: qty 56

## 2015-03-06 MED ORDER — MISOPROSTOL 200 MCG PO TABS
ORAL_TABLET | ORAL | Status: AC
  Filled 2015-03-06: qty 5

## 2015-03-06 MED ORDER — DIPHENHYDRAMINE HCL 25 MG PO CAPS: 25.0000 mg | ORAL_CAPSULE | Freq: Four times a day (QID) | ORAL | Status: DC | PRN

## 2015-03-06 MED ORDER — TERBUTALINE SULFATE 1 MG/ML IJ SOLN
0.2500 mg | Freq: Once | INTRAMUSCULAR | Status: DC | PRN
  Filled 2015-03-06: qty 1

## 2015-03-06 MED ORDER — TETANUS-DIPHTH-ACELL PERTUSSIS 5-2.5-18.5 LF-MCG/0.5 IM SUSP: 0.5000 mL | Freq: Once | INTRAMUSCULAR | Status: DC

## 2015-03-06 MED ORDER — LACTATED RINGERS IV SOLN: 500.0000 mL | INTRAVENOUS | Status: DC | PRN

## 2015-03-06 MED ORDER — ZOLPIDEM TARTRATE 5 MG PO TABS: 5.0000 mg | ORAL_TABLET | Freq: Every evening | ORAL | Status: DC | PRN

## 2015-03-06 MED ORDER — DIBUCAINE 1 % RE OINT: 1.0000 "application " | TOPICAL_OINTMENT | RECTAL | Status: DC | PRN

## 2015-03-06 NOTE — H&P (Signed)
Natasha Fields is a 35 y.o. female, G7 P3033 at 39.2 weeks  Patient Active Problem List   Diagnosis Date Noted  . Normal labor 03/06/2015  . Thrombocytopenia complicating pregnancy (HCC) 04/01/2012  . Blood type, Rh negative 03/16/2012  . H/O congenital dislocation of hip 03/16/2012  . 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 SAB (spontaneous abortion) x 2 03/16/2012  . H/O infertility 03/16/2012    Pregnancy Course: Patient entered care at 10.6 weeks.   EDC of 03/11/15 was established by US.      US evaluations:    weeks - Dating:    weeks - Anatomy:      weeks - FU:    weeks -   Significant prenatal events:   Rhophylac during pregnancy, Hx of 2 precipitous NSVD's. Last evaluation:   39 weeks   VE:3/50/-2 on 03/04/15  Reason for admission:  IOL  Pt States:   Contractions Frequency: none         Contraction severity: n/a         Fetal activity: +FM  OB History    Gravida Para Term Preterm AB TAB SAB Ectopic Multiple Living   7 3 3  0 3 0 3 0 0 3     Past Medical History  Diagnosis Date  . H/O precipitous labor and deliveries, antepartum    Past Surgical History  Procedure Laterality Date  . Wisdom tooth extraction     Family History: family history includes Alzheimer's disease in her maternal grandmother; Arthritis in her maternal grandmother and mother; Emphysema in her maternal grandfather; Heart disease in her paternal grandfather; Hypertension in her father and paternal grandfather; Osteoporosis in her maternal grandmother; Scoliosis in her maternal grandmother; Skin cancer in her paternal grandmother; Thyroid disease in her maternal grandfather. Social History:  reports that she has never smoked. She has never used smokeless tobacco. She reports that she does not drink alcohol or use illicit drugs.   Prenatal Transfer Tool  Maternal Diabetes: No Genetic Screening: Normal Maternal Ultrasounds/Referrals: Normal Fetal Ultrasounds or other Referrals:  None Maternal  Substance Abuse:  No Significant Maternal Medications:  None Significant Maternal Lab Results: Lab values include: Rh negative   ROS:  See HPI above, all other systems are negative  No Known Allergies    Blood pressure 121/88, pulse 99, temperature 98.1 F (36.7 C), temperature source Oral, resp. rate 18, height 5\' 4"  (1.626 m), weight 210 lb (95.255 kg), last menstrual period 06/02/2014, unknown if currently breastfeeding.  Maternal Exam:  Uterine Assessment: Contraction frequency is rare.  Abdomen: Gravid, non tender. Fundal height is aga.  Normal external genitalia, vulva, cervix, uterus and adnexa.  No lesions noted on exam.  Pelvis adequate for delivery.  Fetal presentation: Vertex by Leopold's   Fetal Exam:  Monitor Surveillance : Continuous Monitoring Mode: Ultrasound.  NICHD: Category 1 CTXs: Qnone EFW   7.5 lbs  Physical Exam: Nursing note and vitals reviewed General: alert and cooperative She appears well nourished Psychiatric: Normal mood and affect. Her behavior is normal Head: Normocephalic Eyes: Pupils are equal, round, and reactive to light Neck: Normal range of motion Cardiovascular: RRR without murmur  Respiratory: CTAB. Effort normal  Abd: soft, non-tender, +BS, no rebound, no guarding  Genitourinary: Vagina normal  Neurological: A&Ox3 Skin: Warm and dry  Musculoskeletal: Normal range of motion  Homan's sign negative bilaterally No evidence of DVTs.  Edema:Minimal bilaterally non-pitting edema DTR: 2+ Clonus: None   Prenatal labs: ABO, Rh: A/Negative/-- (06/24 0000) Antibody:  Negative (06/24 0000) Rubella:  immune RPR: Nonreactive (06/24 0000)  HBsAg: Negative (06/24 0000)  HIV: Non-reactive (06/24 0000)  GBS: Negative (12/14 0000) Sickle cell/Hgb electrophoresis:  WNL Pap:   GC: negative Chlamydia: negative Genetic screenings:   Glucola:  wnl   Assessment:  IUP at 39.2 weeks NICHD: Category1 Membranes: intact Bishop Score:  9 GBS negaitve IOL options reviewed with patient including cytotec, foley bulb, AROM, and pitocin R&B of IOL reviewed including serial induction, failure, and/or CS requirement Pain management options reviewed Pt and family verbalize understanding, agrees with treatment plan  and wishes to proceed with induction process    Plan:  Admit to L&D for IOL d/t thrombocytopenia Start induction with pitocin Okay to ambulate around unit with wireless monitors  Continue with labor mgmt as ordered IV pain medication per orders PRN Epidural per patient request Foley cath after patient is comfortable with epidural Anticipate SVD    Attending MD available at all times.   Demarcus Thielke, CNM, MSN 03/06/2015, 10:01 AM

## 2015-03-06 NOTE — Lactation Note (Signed)
This note was copied from the chart of Natasha Fields. Lactation Consultation Note  Patient Name: Natasha Fields ZOXWR'UToday's Date: 03/06/2015 Reason for consult: Initial assessment Baby at 4 hr of life and mom reports feeding are going well. She stated that baby is eager to go to breast and has a good latch. She denies breast or nipple pain. No concerns voiced. Discussed baby behavior, feeding frequency, baby belly size, voids, wt loss, breast changes, and nipple care. Mom stated that she can manually express and has seen colostrum bilaterally. Given a spoon so that she can feed back any milk that she might express. Given lactation handouts. Aware of OP services and support group. Encouraged mom to call out at next feeding [facility] or RN to observe a latch.      Maternal Data Has patient been taught Hand Expression?: Yes Does the patient have breastfeeding experience prior to this delivery?: Yes  Feeding Feeding Type: Breast Fed Length of feed: 35 min  LATCH Score/Interventions                      Lactation Tools Discussed/Used WIC Program: No   Consult Status Consult Status: Follow-up Date: 03/07/15 Follow-up type: In-patient    Rulon Eisenmengerlizabeth E Colletta Spillers 03/06/2015, 7:19 PM

## 2015-03-07 LAB — CBC
HEMATOCRIT: 31.6 % — AB (ref 36.0–46.0)
Hemoglobin: 10.3 g/dL — ABNORMAL LOW (ref 12.0–15.0)
MCH: 27.6 pg (ref 26.0–34.0)
MCHC: 32.6 g/dL (ref 30.0–36.0)
MCV: 84.7 fL (ref 78.0–100.0)
Platelets: 147 10*3/uL — ABNORMAL LOW (ref 150–400)
RBC: 3.73 MIL/uL — ABNORMAL LOW (ref 3.87–5.11)
RDW: 13.2 % (ref 11.5–15.5)
WBC: 9.3 10*3/uL (ref 4.0–10.5)

## 2015-03-07 LAB — RPR: RPR: NONREACTIVE

## 2015-03-07 MED ORDER — IBUPROFEN 600 MG PO TABS: 600.0000 mg | ORAL_TABLET | Freq: Four times a day (QID) | ORAL | Status: AC

## 2015-03-07 NOTE — Lactation Note (Signed)
This note was copied from the chart of Natasha Fields Kerlin. Lactation Consultation Note  P4, Ex BF.  Denies problems or questions. States breastfeeding is going well.  LS9. Discussed cluster feeding, depth of latch, applying ebm for tender nipples.  Reviewed engorgement care and monitoring voids/stools. Suggest parents call if desired to view next feeding.   Patient Name: Natasha Fields Schreiter ZOXWR'UToday's Date: 03/07/2015 Reason for consult: Follow-up assessment   Maternal Data    Feeding Feeding Type: Breast Fed Length of feed: 5 min  LATCH Score/Interventions                      Lactation Tools Discussed/Used     Consult Status Consult Status: Follow-up Date: 03/08/15 Follow-up type: In-patient    Dahlia ByesBerkelhammer, [facility] 03/07/2015, 12:25 PM

## 2015-03-07 NOTE — Discharge Instructions (Signed)
Postpartum Depression and Baby Blues °The postpartum period begins right after the birth of a baby. During this time, there is often a great amount of joy and excitement. It is also a time of many changes in the life of the parents. Regardless of how many times a mother gives birth, each child brings new challenges and dynamics to the family. It is not unusual to have feelings of excitement along with confusing shifts in moods, emotions, and thoughts. All mothers are at risk of developing postpartum depression or the "baby blues." These mood changes can occur right after giving birth, or they may occur many months after giving birth. The baby blues or postpartum depression can be mild or severe. Additionally, postpartum depression can go away rather quickly, or it can be a long-term condition.  °CAUSES °Raised hormone levels and the rapid drop in those levels are thought to be a main cause of postpartum depression and the baby blues. A number of hormones change during and after pregnancy. Estrogen and progesterone usually decrease right after the delivery of your baby. The levels of thyroid hormone and various cortisol steroids also rapidly drop. Other factors that play a role in these mood changes include major life events and genetics.  °RISK FACTORS °If you have any of the following risks for the baby blues or postpartum depression, know what symptoms to watch out for during the postpartum period. Risk factors that may increase the likelihood of getting the baby blues or postpartum depression include: °· Having a personal or family history of depression.   °· Having depression while being pregnant.   °· Having premenstrual mood issues or mood issues related to oral contraceptives. °· Having a lot of life stress.   °· Having marital conflict.   °· Lacking a social support network.   °· Having a baby with special needs.   °· Having health problems, such as diabetes.   °SIGNS AND SYMPTOMS °Symptoms of baby blues  include: °· Brief changes in mood, such as going from extreme happiness to sadness. °· Decreased concentration.   °· Difficulty sleeping.   °· Crying spells, tearfulness.   °· Irritability.   °· Anxiety.   °Symptoms of postpartum depression typically begin within the first month after giving birth. These symptoms include: °· Difficulty sleeping or excessive sleepiness.   °· Marked weight loss.   °· Agitation.   °· Feelings of worthlessness.   °· Lack of interest in activity or food.   °Postpartum psychosis is a very serious condition and can be dangerous. Fortunately, it is rare. Displaying any of the following symptoms is cause for immediate medical attention. Symptoms of postpartum psychosis include:  °· Hallucinations and delusions.   °· Bizarre or disorganized behavior.   °· Confusion or disorientation.   °DIAGNOSIS  °A diagnosis is made by an evaluation of your symptoms. There are no medical or lab tests that lead to a diagnosis, but there are various questionnaires that a health care provider may use to identify those with the baby blues, postpartum depression, or psychosis. Often, a screening tool called the Edinburgh Postnatal Depression Scale is used to diagnose depression in the postpartum period.  °TREATMENT °The baby blues usually goes away on its own in 1-2 weeks. Social support is often all that is needed. You will be encouraged to get adequate sleep and rest. Occasionally, you may be given medicines to help you sleep.  °Postpartum depression requires treatment because it can last several months or longer if it is not treated. Treatment may include individual or group therapy, medicine, or both to address any social, physiological, and psychological   factors that may play a role in the depression. Regular exercise, a healthy diet, rest, and social support may also be strongly recommended.  °Postpartum psychosis is more serious and needs treatment right away. Hospitalization is often needed. °HOME CARE  INSTRUCTIONS °· Get as much rest as you can. Nap when the baby sleeps.   °· Exercise regularly. Some women find yoga and walking to be beneficial.   °· Eat a balanced and nourishing diet.   °· Do little things that you enjoy. Have a cup of tea, take a bubble bath, read your favorite magazine, or listen to your favorite music. °· Avoid alcohol.   °· Ask for help with household chores, cooking, grocery shopping, or running errands as needed. Do not try to do everything.   °· Talk to people close to you about how you are feeling. Get support from your partner, family members, friends, or other new moms. °· Try to stay positive in how you think. Think about the things you are grateful for.   °· Do not spend a lot of time alone.   °· Only take over-the-counter or prescription medicine as directed by your health care provider. °· Keep all your postpartum appointments.   °· Let your health care provider know if you have any concerns.   °SEEK MEDICAL CARE IF: °You are having a reaction to or problems with your medicine. °SEEK IMMEDIATE MEDICAL CARE IF: °· You have suicidal feelings.   °· You think you may harm the baby or someone else. °MAKE SURE YOU: °· Understand these instructions. °· Will watch your condition. °· Will get help right away if you are not doing well or get worse. °  °This information is not intended to replace advice given to you by your health care provider. Make sure you discuss any questions you have with your health care provider. °  °Document Released: 11/14/2003 Document Revised: 02/14/2013 Document Reviewed: 11/21/2012 °Elsevier Interactive Patient Education ©2016 [employer] ° °

## 2015-03-07 NOTE — Lactation Note (Signed)
This note was copied from the chart of Natasha Fields. Lactation Consultation Note  Patient Name: Natasha Fields XBJYN'WToday's Date: 03/07/2015 Reason for consult: Follow-up assessment Mom [facility]. Baby sleepy but demonstrated some good suckling bursts, swallowing motions noted. Slight nipple compression noted when baby came off the breast. Advised to continue to work on depth. Lips well flanged with latch. Baby extends tongue well and seemed satiated after the feeding.  Mom experienced breast feeder with 3 older children. Basics reviewed, advised baby should be at the breast 8-12 times in 24 hours and with feeding ques. Engorgement care reviewed if needed. Advised of OP services and support group. Monitor voids/stools. Call for questions/concerns.   Maternal Data    Feeding Feeding Type: Breast Fed Length of feed: 30 min  LATCH Score/Interventions                      Lactation Tools Discussed/Used     Consult Status Consult Status: Follow-up Date: 03/08/15 Follow-up type: In-patient    Alfred LevinsGranger, [place] Sandiford Ann 03/07/2015, 1:39 PM

## 2015-03-07 NOTE — Discharge Summary (Signed)
Vaginal Delivery Discharge Summary  Natasha Fields  DOB:    September 24, 1980 MRN:    [id number] CSN:    [id number]  Date of admission:                  03/06/2015  Date of discharge:                   03/07/2015  Procedures this admission:   IOL for Thrombocytopenia.  SVD with Repair of 2nd Degree Perineal Laceration  Date of Delivery: 03/06/2015  Newborn Data:  Live born female  Birth Weight: 8 lb 1.1 oz (3660 g) APGAR: 9, 9  Home with mother. Name: Natasha Fields Circumcision Plan: N/A  History of Present Illness:  Ms. Natasha Fields is a 35 y.o. female, [id number], who presents at [redacted]w[redacted]d weeks gestation. The patient has been followed at [facility]. She was admitted induction of labor. Her pregnancy has been complicated by:  Patient Active Problem List   Diagnosis Date Noted  . Second-degree perineal laceration, with delivery 03/07/2015  . Normal labor 03/06/2015  . Normal vaginal delivery 03/06/2015  . Thrombocytopenia complicating pregnancy (HCC) 04/01/2012  . Blood type, Rh negative 03/16/2012  . H/O congenital dislocation of hip 03/16/2012  . 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 SAB (spontaneous abortion) x 2 03/16/2012  . H/O infertility 03/16/2012     Hospital Course:  Admitted Jan 11. Negative GBS. Progressed  After pitocin administration. Utilized coping skills for pain management.  Delivery was performed by V.Standard without complication. Patient and baby tolerated the procedure without difficulty, with  2nd degree perineal laceration noted. Infant status was stable and remained in room with mother.  Mother and infant then had an uncomplicated postpartum course, with breast feeding going well. Mom's physical exam was WNL, and she was discharged home in stable condition. Contraception plan was vasectomy completed prior to delivery.  She received adequate benefit from po pain  medications.   Feeding:  breast  Contraception:  vasectomy  Hemoglobin Results:  CBC Latest Ref Rng 03/07/2015 03/06/2015 03/06/2015  WBC 4.0 - 10.5 K/uL 9.3 - 7.6  Hemoglobin 12.0 - 15.0 g/dL 10.3(L) - 11.5(L)  Hematocrit 36.0 - 46.0 % 31.6(L) - 35.0(L)  Platelets 150 - 400 K/uL 147(L) 213 PLATELET CLUMPS NOTED ON SMEAR, COUNT APPEARS ADEQUATE     Discharge Physical Exam:   General: alert, cooperative and no distress Mood/Affect: Appropriate/Bright Chest: clear to auscultation, no wheezes, rales or rhonchi, symmetric air entry.  CVS exam: normal rate and regular rhythm. Breast exam: not examined. Abdomen:  + bowel sounds, Soft Uterine Fundus: firm Lochia: appropriate Laceration: Not Examined DVT Evaluation: No evidence of DVT seen on physical exam. No cords or calf tenderness. No significant calf/ankle edema. Skin exam: Warm, Dry  Procedures &/or Complications: Intrapartum Procedures: spontaneous vaginal delivery Postpartum Procedures: none Complications-Operative and Postpartum: 2nd degree perineal laceration   Discharge Information:  Diagnoses: Term Pregnancy-delivered Activity:  pelvic rest Diet:   routine Medications: Ibuprofen Condition: stable Instructions:  Pain Management, Peri-Care, Breastfeeding, Who and When to call for postpartum complications. Information Sheet(s) given Postpartum Depression and Baby Blues Discharge to: home  Follow-up Information    Follow up with [facility]. Schedule an appointment as soon as possible for a visit in 6 weeks.   Specialty:  Obstetrics and Gynecology   Why:  Please call if you have any questions or concerns, prior to your next visit.  Contact information:   [address] [place] [phone]       Cherre RobinsJessica L Alys Dulak MSN, CNM 03/07/2015 4:55 PM

## 2015-03-07 NOTE — Progress Notes (Signed)
Natasha MarionKimberly J Fields  Post Partum Day 1:S/P SVD with repair of a 2nd Degree Perineal Laceration  Subjective: Patient up ad lib, denies syncope or dizziness. Reports consuming regular diet without issues and denies N/V. Denies issues with urination and reports bleeding is "lightening up."  Patient is breastfeeding and reports going well. Vascetomy for postpartum contraception has already been completed.  Pain is being appropriately managed with use of motrin.  Objective: Filed Vitals:   03/06/15 1640 03/06/15 1740 03/06/15 2140 03/07/15 0630  BP: 119/61 121/58 112/56 96/55  Pulse: 64 66 84 62  Temp: 98.1 F (36.7 C) 98 F (36.7 C) 98.6 F (37 C) 98.2 F (36.8 C)  TempSrc: Oral Oral Oral Oral  Resp: 20 18 18 18   Height:      Weight:      SpO2: 100% 100% 100%     Recent Labs  03/06/15 0805 03/07/15 0540  HGB 11.5* 10.3*  HCT 35.0* 31.6*    Physical Exam:  General: alert, cooperative and no distress Mood/Affect: Appropriate/Bright Lungs: clear to auscultation, no wheezes, rales or rhonchi, symmetric air entry.  Heart: normal rate and regular rhythm. Breast: not examined. Abdomen:  + bowel sounds, Soft Uterine Fundus: firm, U/-1 Lochia: appropriate Laceration: Not Examined Skin: Warm, Dry DVT Evaluation: No evidence of DVT seen on physical exam. No cords or calf tenderness. No significant calf/ankle edema.  Assessment S/P Vaginal Delivery-Day 1 Normal [facility] BreastFeeding Desires Discharge  Plan:  Informed that if infant cleared for discharge, would discharge too Discharge instructions given including s/s of postpartum complications and follow up appts Continue current care Dr. Lynford HumphreySR to be updated on patient status   Cherre RobinsJessica L Abhimanyu Cruces, MSN, CNM 03/07/2015, 9:07 AM

## 2015-03-11 ENCOUNTER — Inpatient Hospital Stay (HOSPITAL_COMMUNITY)
Admission: AD | Admit: 2015-03-11 | Payer: Federal, State, Local not specified - PPO | Source: Ambulatory Visit | Admitting: Obstetrics & Gynecology

## 2017-08-02 DIAGNOSIS — J301 Allergic rhinitis due to pollen: Secondary | ICD-10-CM | POA: Diagnosis not present

## 2017-08-05 DIAGNOSIS — J301 Allergic rhinitis due to pollen: Secondary | ICD-10-CM | POA: Diagnosis not present

## 2017-08-12 DIAGNOSIS — J301 Allergic rhinitis due to pollen: Secondary | ICD-10-CM | POA: Diagnosis not present

## 2017-08-18 DIAGNOSIS — J301 Allergic rhinitis due to pollen: Secondary | ICD-10-CM | POA: Diagnosis not present

## 2017-08-23 DIAGNOSIS — J301 Allergic rhinitis due to pollen: Secondary | ICD-10-CM | POA: Diagnosis not present

## 2017-08-30 DIAGNOSIS — J301 Allergic rhinitis due to pollen: Secondary | ICD-10-CM | POA: Diagnosis not present

## 2017-09-08 DIAGNOSIS — J301 Allergic rhinitis due to pollen: Secondary | ICD-10-CM | POA: Diagnosis not present

## 2017-09-15 DIAGNOSIS — J301 Allergic rhinitis due to pollen: Secondary | ICD-10-CM | POA: Diagnosis not present

## 2017-09-23 DIAGNOSIS — J301 Allergic rhinitis due to pollen: Secondary | ICD-10-CM | POA: Diagnosis not present

## 2017-10-04 DIAGNOSIS — J301 Allergic rhinitis due to pollen: Secondary | ICD-10-CM | POA: Diagnosis not present

## 2017-10-13 DIAGNOSIS — J301 Allergic rhinitis due to pollen: Secondary | ICD-10-CM | POA: Diagnosis not present

## 2017-10-20 DIAGNOSIS — J301 Allergic rhinitis due to pollen: Secondary | ICD-10-CM | POA: Diagnosis not present

## 2017-10-27 DIAGNOSIS — J301 Allergic rhinitis due to pollen: Secondary | ICD-10-CM | POA: Diagnosis not present

## 2017-11-10 DIAGNOSIS — J3089 Other allergic rhinitis: Secondary | ICD-10-CM | POA: Diagnosis not present

## 2017-11-15 DIAGNOSIS — J301 Allergic rhinitis due to pollen: Secondary | ICD-10-CM | POA: Diagnosis not present

## 2017-11-22 DIAGNOSIS — J301 Allergic rhinitis due to pollen: Secondary | ICD-10-CM | POA: Diagnosis not present

## 2017-12-21 DIAGNOSIS — J301 Allergic rhinitis due to pollen: Secondary | ICD-10-CM | POA: Diagnosis not present

## 2017-12-27 DIAGNOSIS — J301 Allergic rhinitis due to pollen: Secondary | ICD-10-CM | POA: Diagnosis not present

## 2018-01-11 DIAGNOSIS — J301 Allergic rhinitis due to pollen: Secondary | ICD-10-CM | POA: Diagnosis not present

## 2018-01-13 DIAGNOSIS — K080 Exfoliation of teeth due to systemic causes: Secondary | ICD-10-CM | POA: Diagnosis not present

## 2018-01-17 DIAGNOSIS — J3089 Other allergic rhinitis: Secondary | ICD-10-CM | POA: Diagnosis not present

## 2018-01-18 DIAGNOSIS — J3089 Other allergic rhinitis: Secondary | ICD-10-CM | POA: Diagnosis not present

## 2018-02-03 DIAGNOSIS — J3089 Other allergic rhinitis: Secondary | ICD-10-CM | POA: Diagnosis not present

## 2018-02-09 DIAGNOSIS — J3089 Other allergic rhinitis: Secondary | ICD-10-CM | POA: Diagnosis not present

## 2018-03-09 DIAGNOSIS — J3089 Other allergic rhinitis: Secondary | ICD-10-CM | POA: Diagnosis not present

## 2018-03-21 DIAGNOSIS — J3089 Other allergic rhinitis: Secondary | ICD-10-CM | POA: Diagnosis not present

## 2018-03-28 DIAGNOSIS — J301 Allergic rhinitis due to pollen: Secondary | ICD-10-CM | POA: Diagnosis not present

## 2018-04-11 DIAGNOSIS — J301 Allergic rhinitis due to pollen: Secondary | ICD-10-CM | POA: Diagnosis not present

## 2018-04-19 DIAGNOSIS — J301 Allergic rhinitis due to pollen: Secondary | ICD-10-CM | POA: Diagnosis not present

## 2018-04-28 DIAGNOSIS — J301 Allergic rhinitis due to pollen: Secondary | ICD-10-CM | POA: Diagnosis not present

## 2018-05-04 DIAGNOSIS — J301 Allergic rhinitis due to pollen: Secondary | ICD-10-CM | POA: Diagnosis not present

## 2019-03-10 DIAGNOSIS — J309 Allergic rhinitis, unspecified: Secondary | ICD-10-CM | POA: Diagnosis not present

## 2019-03-10 DIAGNOSIS — J3489 Other specified disorders of nose and nasal sinuses: Secondary | ICD-10-CM | POA: Diagnosis not present

## 2019-03-10 DIAGNOSIS — H6123 Impacted cerumen, bilateral: Secondary | ICD-10-CM | POA: Diagnosis not present

## 2019-03-10 DIAGNOSIS — G4730 Sleep apnea, unspecified: Secondary | ICD-10-CM | POA: Diagnosis not present

## 2019-05-12 DIAGNOSIS — G4730 Sleep apnea, unspecified: Secondary | ICD-10-CM | POA: Diagnosis not present

## 2019-05-12 DIAGNOSIS — J3489 Other specified disorders of nose and nasal sinuses: Secondary | ICD-10-CM | POA: Diagnosis not present

## 2019-05-12 DIAGNOSIS — T7840XA Allergy, unspecified, initial encounter: Secondary | ICD-10-CM | POA: Diagnosis not present

## 2019-05-22 DIAGNOSIS — I8311 Varicose veins of right lower extremity with inflammation: Secondary | ICD-10-CM | POA: Diagnosis not present

## 2019-05-22 DIAGNOSIS — I8312 Varicose veins of left lower extremity with inflammation: Secondary | ICD-10-CM | POA: Diagnosis not present

## 2019-05-22 DIAGNOSIS — I83813 Varicose veins of bilateral lower extremities with pain: Secondary | ICD-10-CM | POA: Diagnosis not present

## 2019-06-12 DIAGNOSIS — Z713 Dietary counseling and surveillance: Secondary | ICD-10-CM | POA: Diagnosis not present

## 2019-06-14 ENCOUNTER — Other Ambulatory Visit: Payer: Self-pay | Admitting: *Deleted

## 2019-06-14 DIAGNOSIS — I83893 Varicose veins of bilateral lower extremities with other complications: Secondary | ICD-10-CM

## 2019-06-20 ENCOUNTER — Encounter (HOSPITAL_COMMUNITY): Payer: Federal, State, Local not specified - PPO

## 2019-06-21 ENCOUNTER — Encounter: Payer: Federal, State, Local not specified - PPO | Admitting: Vascular Surgery

## 2019-07-03 ENCOUNTER — Telehealth (HOSPITAL_COMMUNITY): Payer: Self-pay

## 2019-07-03 NOTE — Telephone Encounter (Signed)

## 2019-07-04 ENCOUNTER — Ambulatory Visit (HOSPITAL_COMMUNITY)
Admission: RE | Admit: 2019-07-04 | Discharge: 2019-07-04 | Disposition: A | Discharge status: Home / Self Care | Payer: Federal, State, Local not specified - PPO | Source: Ambulatory Visit | Attending: Vascular Surgery | Admitting: Vascular Surgery

## 2019-07-04 ENCOUNTER — Other Ambulatory Visit: Payer: Self-pay

## 2019-07-04 ENCOUNTER — Ambulatory Visit (INDEPENDENT_AMBULATORY_CARE_PROVIDER_SITE_OTHER): Payer: Federal, State, Local not specified - PPO | Admitting: Physician Assistant

## 2019-07-04 VITALS — BP 123/64 | HR 96 | Temp 98.2°F | Resp 16 | Ht 64.0 in | Wt 169.0 lb

## 2019-07-04 DIAGNOSIS — I83893 Varicose veins of bilateral lower extremities with other complications: Secondary | ICD-10-CM

## 2019-07-04 NOTE — Progress Notes (Signed)
Requested by:  Lilian Coma., MD No address on file  Reason for consultation: lower extremity varicosities    History of Present Illness   Natasha Fields is a 39 y.o. (1981/02/06) female who presents for evaluation of posterior right lower extremity venous varicosity.  She endorses a throbbing, burning pain in this area.  She first noticed a bulging vein in this area approximately 8 years ago.  There are no aggravating or alleviating factors.  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 H/O precipitous labor and deliveries, antepartum     Past Surgical History:  Procedure Laterality Date  . WISDOM TOOTH EXTRACTION      Social History   Socioeconomic History  . Marital status: Married    Spouse name: Not on file  . Number of children: Not on file  . Years of education: Not on file  . Highest education level: Not on file  Occupational History  . Not on file  Tobacco Use  . Smoking status: Never Smoker  . Smokeless tobacco: Never Used  Substance and Sexual Activity  . Alcohol use: No  . Drug use: No  . Sexual activity: Yes    Birth control/protection: Condom  Other Topics Concern  . Not on file  Social History Narrative  . Not on file   Social Determinants of Health   Financial Resource Strain:   . Difficulty of Paying Living Expenses:   Food Insecurity:   . Worried About Charity fundraiser in the Last Year:   . Arboriculturist in the Last Year:   Transportation Needs:   . Film/video editor (Medical):   Marland Kitchen Lack of Transportation (Non-Medical):   Physical Activity:   . Days of Exercise per Week:   . Minutes of Exercise per Session:     Stress:   . Feeling of Stress :   Social Connections:   . Frequency of Communication with Friends and Family:   . Frequency of Social Gatherings with Friends and Family:   . Attends Religious Services:   . Active Member of Clubs or Organizations:   . Attends Archivist Meetings:   Marland Kitchen Marital Status:   Intimate Partner Violence:   . Fear of Current or Ex-Partner:   . Emotionally Abused:   Marland Kitchen Physically Abused:   . Sexually Abused:     Family History  Problem Relation Age of Onset  . Arthritis Mother   . Hypertension Father   . Scoliosis Maternal Grandmother   . Osteoporosis Maternal Grandmother   . Arthritis Maternal Grandmother   . Alzheimer's disease Maternal Grandmother   . Emphysema Maternal Grandfather   . Thyroid disease Maternal Grandfather   . Skin cancer Paternal Grandmother   . Hypertension Paternal Grandfather   . Heart disease Paternal Grandfather   Family history positive for venous varicosities  Current Outpatient Medications  Medication Sig Dispense Refill  . calcium carbonate (TUMS - DOSED IN MG ELEMENTAL CALCIUM) 500 MG chewable tablet Chew 2 tablets by mouth daily as needed for heartburn.    Marland Kitchen ibuprofen (ADVIL,MOTRIN) 600 MG tablet Take 1 tablet (600 mg total) by mouth every 6 (six) hours. 30 tablet 0  . ibuprofen (ADVIL,MOTRIN) 600 MG tablet Take  1 tablet (600 mg total) by mouth every 6 (six) hours. 30 tablet 0  . prenatal vitamin w/FE, FA (PRENATAL 1 + 1) 27-1 MG TABS Take 1 tablet by mouth daily.       No current facility-administered medications for this visit.    No Known Allergies  REVIEW OF SYSTEMS (negative unless checked):   Cardiac:  []  Chest pain or chest pressure? []  Shortness of breath upon activity? []  Shortness of breath when lying flat? []  Irregular heart rhythm?  Vascular:  []  Pain in calf, thigh, or hip brought on by walking? []  Pain in feet at night that wakes you up from your sleep? []  Blood clot in your veins? []   Leg swelling?  Pulmonary:  []  Oxygen at home? []  Productive cough? []  Wheezing?  Neurologic:  []  Sudden weakness in arms or legs? []  Sudden numbness in arms or legs? []  Sudden onset of difficult speaking or slurred speech? []  Temporary loss of vision in one eye? []  Problems with dizziness?  Gastrointestinal:  []  Blood in stool? []  Vomited blood?  Genitourinary:  []  Burning when urinating? []  Blood in urine?  Psychiatric:  []  Major depression  Hematologic:  []  Bleeding problems? []  Problems with blood clotting?  Dermatologic:  []  Rashes or ulcers?  Constitutional:  []  Fever or chills?  Ear/Nose/Throat:  []  Change in hearing? []  Nose bleeds? []  Sore throat?  Musculoskeletal:  []  Back pain? []  Joint pain? []  Muscle pain?   Physical Examination     Vitals:   07/04/19 1555  Weight: 169 lb (76.7 kg)  Height: 5\' 4"  (1.626 m)   General:  WDWN in NAD; vital signs documented above Gait: Steady no ataxia HENT: WNL, normocephalic Pulmonary: normal non-labored breathing , without Rales, rhonchi,  wheezing Cardiac: regular HR, without  Murmurs without carotid bruits Abdomen: soft, NT, no masses Skin: without rashes Vascular Exam/Pulses:  Right Left  Radial 2+ (normal) 2+ (normal)  Ulnar  not evaluated  not evaluated  Femoral 2+ (normal) 2+ (normal)  Popliteal  not palpable  not palpable  DP 2+ (normal) 2+ (normal)  PT 1+ (weak) 1+ (weak)   Extremities: with varicose veins, with reticular veins, without edema, without stasis pigmentation, without lipodermatosclerosis, without ulcers.  Feet are warm and well perfused Musculoskeletal: no muscle wasting or atrophy  Neurologic: A&O X 3;  No focal weakness or paresthesias are detected Psychiatric:  The pt has Normal affect.       Non-invasive Vascular Imaging   BLE Venous Insufficiency Duplex (07/04/2019):   RLE:   no DVT and SVT,   no GSV reflux   Right short SSV reflux   no deep venous  reflux   LLE:  no DVT and SVT,   no GSV reflux    no SSV reflux   no deep venous reflux   Medical Decision Making   Natasha Fields is a 39 y.o. female who presents with: right varicose veins with complications of pain   Based on the patient's history and examination, I recommend: Possible sclerotherapy to right posterior lower extremity varicosity.  I discussed with the patient the use of her 20-30 mm knee high compression stockings   We discussed out-of-pocket expenses for possible sclerotherapy as this is not typically covered by .  We will have our sclerotherapist contact her by phone in regards to possible options for treatment.  Thank you for allowing to participate in this patient's care.   , PA-C [facility] Office: [phone]  07/04/2019, 3:09 PM  Clinic MD: Early

## 2019-07-17 DIAGNOSIS — Z713 Dietary counseling and surveillance: Secondary | ICD-10-CM | POA: Diagnosis not present

## 2019-07-25 DIAGNOSIS — Z6829 Body mass index (BMI) 29.0-29.9, adult: Secondary | ICD-10-CM | POA: Diagnosis not present

## 2019-07-25 DIAGNOSIS — Z01419 Encounter for gynecological examination (general) (routine) without abnormal findings: Secondary | ICD-10-CM | POA: Diagnosis not present

## 2019-08-16 DIAGNOSIS — Z713 Dietary counseling and surveillance: Secondary | ICD-10-CM | POA: Diagnosis not present

## 2019-08-21 ENCOUNTER — Other Ambulatory Visit: Payer: Self-pay

## 2019-08-21 ENCOUNTER — Ambulatory Visit (INDEPENDENT_AMBULATORY_CARE_PROVIDER_SITE_OTHER): Payer: Self-pay

## 2019-08-21 DIAGNOSIS — I8393 Asymptomatic varicose veins of bilateral lower extremities: Secondary | ICD-10-CM

## 2019-08-21 NOTE — Progress Notes (Signed)
Treated areas of concern on both front and backs of bilateral legs for spider and reticular veins with Asclera 1% administered with a 27g butterfly.  Patient received a total of 4 mL. She tolerated it very well. Anticipate good results. May need another tx in the future for touch ups which we discussed. Pt given post procedure care instructions both verbally and on handout.     Photos: Yes.    Compression stockings applied: Yes.

## 2019-08-22 ENCOUNTER — Ambulatory Visit: Payer: Federal, State, Local not specified - PPO

## 2019-09-14 DIAGNOSIS — Z713 Dietary counseling and surveillance: Secondary | ICD-10-CM | POA: Diagnosis not present

## 2019-10-12 DIAGNOSIS — Z713 Dietary counseling and surveillance: Secondary | ICD-10-CM | POA: Diagnosis not present

## 2019-11-09 DIAGNOSIS — Z713 Dietary counseling and surveillance: Secondary | ICD-10-CM | POA: Diagnosis not present

## 2019-12-06 DIAGNOSIS — Z713 Dietary counseling and surveillance: Secondary | ICD-10-CM | POA: Diagnosis not present

## 2020-01-03 DIAGNOSIS — Z713 Dietary counseling and surveillance: Secondary | ICD-10-CM | POA: Diagnosis not present

## 2020-01-31 DIAGNOSIS — Z713 Dietary counseling and surveillance: Secondary | ICD-10-CM | POA: Diagnosis not present

## 2020-02-28 DIAGNOSIS — Z713 Dietary counseling and surveillance: Secondary | ICD-10-CM | POA: Diagnosis not present

## 2020-03-23 DIAGNOSIS — Z20822 Contact with and (suspected) exposure to covid-19: Secondary | ICD-10-CM | POA: Diagnosis not present

## 2020-04-16 DIAGNOSIS — Z713 Dietary counseling and surveillance: Secondary | ICD-10-CM | POA: Diagnosis not present

## 2020-05-15 DIAGNOSIS — Z713 Dietary counseling and surveillance: Secondary | ICD-10-CM | POA: Diagnosis not present

## 2020-06-19 DIAGNOSIS — Z713 Dietary counseling and surveillance: Secondary | ICD-10-CM | POA: Diagnosis not present

## 2020-07-15 DIAGNOSIS — Z713 Dietary counseling and surveillance: Secondary | ICD-10-CM | POA: Diagnosis not present

## 2020-08-13 DIAGNOSIS — Z1231 Encounter for screening mammogram for malignant neoplasm of breast: Secondary | ICD-10-CM | POA: Diagnosis not present

## 2020-08-13 DIAGNOSIS — Z01419 Encounter for gynecological examination (general) (routine) without abnormal findings: Secondary | ICD-10-CM | POA: Diagnosis not present

## 2020-08-15 DIAGNOSIS — Z713 Dietary counseling and surveillance: Secondary | ICD-10-CM | POA: Diagnosis not present

## 2020-09-17 DIAGNOSIS — Z713 Dietary counseling and surveillance: Secondary | ICD-10-CM | POA: Diagnosis not present

## 2021-10-03 DIAGNOSIS — Z01419 Encounter for gynecological examination (general) (routine) without abnormal findings: Secondary | ICD-10-CM | POA: Diagnosis not present

## 2021-10-03 DIAGNOSIS — Z124 Encounter for screening for malignant neoplasm of cervix: Secondary | ICD-10-CM | POA: Diagnosis not present

## 2021-10-03 DIAGNOSIS — R5383 Other fatigue: Secondary | ICD-10-CM | POA: Diagnosis not present

## 2021-10-03 DIAGNOSIS — Z01411 Encounter for gynecological examination (general) (routine) with abnormal findings: Secondary | ICD-10-CM | POA: Diagnosis not present

## 2021-10-03 DIAGNOSIS — Z1231 Encounter for screening mammogram for malignant neoplasm of breast: Secondary | ICD-10-CM | POA: Diagnosis not present

## 2021-10-03 DIAGNOSIS — Z113 Encounter for screening for infections with a predominantly sexual mode of transmission: Secondary | ICD-10-CM | POA: Diagnosis not present

## 2021-10-03 DIAGNOSIS — Z8679 Personal history of other diseases of the circulatory system: Secondary | ICD-10-CM | POA: Diagnosis not present

## 2022-01-07 DIAGNOSIS — J069 Acute upper respiratory infection, unspecified: Secondary | ICD-10-CM | POA: Diagnosis not present

## 2022-01-07 DIAGNOSIS — H6122 Impacted cerumen, left ear: Secondary | ICD-10-CM | POA: Diagnosis not present
